# Patient Record
Sex: Female | Born: 1995 | Hispanic: Yes | Marital: Single | Smoking: Never smoker
Health system: Southern US, Community
[De-identification: ages and names within clinical notes are randomized; demographics above are authoritative.]

## PROBLEM LIST (undated history)

## (undated) ENCOUNTER — Inpatient Hospital Stay (HOSPITAL_COMMUNITY): Payer: Self-pay

## (undated) DIAGNOSIS — O139 Gestational [pregnancy-induced] hypertension without significant proteinuria, unspecified trimester: Secondary | ICD-10-CM

## (undated) DIAGNOSIS — Z789 Other specified health status: Secondary | ICD-10-CM

## (undated) DIAGNOSIS — Z8759 Personal history of other complications of pregnancy, childbirth and the puerperium: Secondary | ICD-10-CM

## (undated) HISTORY — PX: WISDOM TOOTH EXTRACTION: SHX21

## (undated) HISTORY — DX: Other specified health status: Z78.9

## (undated) HISTORY — DX: Personal history of other complications of pregnancy, childbirth and the puerperium: Z87.59

---
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
## 2014-02-09 ENCOUNTER — Ambulatory Visit: Payer: Self-pay | Admitting: Pediatrics

## 2020-08-01 DIAGNOSIS — Z3201 Encounter for pregnancy test, result positive: Secondary | ICD-10-CM | POA: Diagnosis not present

## 2020-08-06 ENCOUNTER — Emergency Department (HOSPITAL_COMMUNITY): Payer: BC Managed Care – PPO

## 2020-08-06 ENCOUNTER — Encounter (HOSPITAL_COMMUNITY): Payer: Self-pay | Admitting: Emergency Medicine

## 2020-08-06 ENCOUNTER — Emergency Department (HOSPITAL_COMMUNITY)
Admission: EM | Admit: 2020-08-06 | Discharge: 2020-08-07 | Disposition: A | Discharge status: Home / Self Care | Payer: BC Managed Care – PPO | Attending: Emergency Medicine | Admitting: Emergency Medicine

## 2020-08-06 ENCOUNTER — Other Ambulatory Visit: Payer: Self-pay

## 2020-08-06 DIAGNOSIS — Z114 Encounter for screening for human immunodeficiency virus [HIV]: Secondary | ICD-10-CM | POA: Diagnosis not present

## 2020-08-06 DIAGNOSIS — Z79899 Other long term (current) drug therapy: Secondary | ICD-10-CM | POA: Insufficient documentation

## 2020-08-06 DIAGNOSIS — O208 Other hemorrhage in early pregnancy: Secondary | ICD-10-CM | POA: Insufficient documentation

## 2020-08-06 DIAGNOSIS — N939 Abnormal uterine and vaginal bleeding, unspecified: Secondary | ICD-10-CM | POA: Diagnosis not present

## 2020-08-06 DIAGNOSIS — O468X1 Other antepartum hemorrhage, first trimester: Secondary | ICD-10-CM

## 2020-08-06 DIAGNOSIS — Z3A01 Less than 8 weeks gestation of pregnancy: Secondary | ICD-10-CM | POA: Insufficient documentation

## 2020-08-06 LAB — URINALYSIS, ROUTINE W REFLEX MICROSCOPIC
Bilirubin Urine: NEGATIVE
Glucose, UA: NEGATIVE mg/dL
Hgb urine dipstick: NEGATIVE
Ketones, ur: 80 mg/dL — AB
Leukocytes,Ua: NEGATIVE
Nitrite: NEGATIVE
Protein, ur: NEGATIVE mg/dL
Specific Gravity, Urine: 1.028 (ref 1.005–1.030)
pH: 5 (ref 5.0–8.0)

## 2020-08-06 LAB — WET PREP, GENITAL
Sperm: NONE SEEN
Trich, Wet Prep: NONE SEEN
Yeast Wet Prep HPF POC: NONE SEEN

## 2020-08-06 LAB — COMPREHENSIVE METABOLIC PANEL
ALT: 22 U/L (ref 0–44)
AST: 17 U/L (ref 15–41)
Albumin: 4.2 g/dL (ref 3.5–5.0)
Alkaline Phosphatase: 61 U/L (ref 38–126)
Anion gap: 11 (ref 5–15)
BUN: 11 mg/dL (ref 6–20)
CO2: 22 mmol/L (ref 22–32)
Calcium: 8.6 mg/dL — ABNORMAL LOW (ref 8.9–10.3)
Chloride: 103 mmol/L (ref 98–111)
Creatinine, Ser: 0.61 mg/dL (ref 0.44–1.00)
GFR, Estimated: >60 mL/min (ref >60)
Glucose, Bld: 62 mg/dL — ABNORMAL LOW (ref 70–99)
Potassium: 3.4 mmol/L — ABNORMAL LOW (ref 3.5–5.1)
Sodium: 136 mmol/L (ref 135–145)
Total Bilirubin: 1 mg/dL (ref 0.3–1.2)
Total Protein: 7.2 g/dL (ref 6.5–8.1)

## 2020-08-06 LAB — PREGNANCY, URINE: Preg Test, Ur: POSITIVE — AB

## 2020-08-06 LAB — CBC WITH DIFFERENTIAL/PLATELET
Abs Immature Granulocytes: 0.06 10*3/uL (ref 0.00–0.07)
Basophils Absolute: 0.1 10*3/uL (ref 0.0–0.1)
Basophils Relative: 0 %
Eosinophils Absolute: 0.1 10*3/uL (ref 0.0–0.5)
Eosinophils Relative: 0 %
HCT: 41.8 % (ref 36.0–46.0)
Hemoglobin: 14 g/dL (ref 12.0–15.0)
Immature Granulocytes: 0 %
Lymphocytes Relative: 17 %
Lymphs Abs: 2.2 10*3/uL (ref 0.7–4.0)
MCH: 29.5 pg (ref 26.0–34.0)
MCHC: 33.5 g/dL (ref 30.0–36.0)
MCV: 88 fL (ref 80.0–100.0)
Monocytes Absolute: 0.7 10*3/uL (ref 0.1–1.0)
Monocytes Relative: 5 %
Neutro Abs: 10.4 10*3/uL — ABNORMAL HIGH (ref 1.7–7.7)
Neutrophils Relative %: 78 %
Platelets: 331 10*3/uL (ref 150–400)
RBC: 4.75 MIL/uL (ref 3.87–5.11)
RDW: 12.2 % (ref 11.5–15.5)
WBC: 13.5 10*3/uL — ABNORMAL HIGH (ref 4.0–10.5)
nRBC: 0 % (ref 0.0–0.2)

## 2020-08-06 LAB — HCG, QUANTITATIVE, PREGNANCY: hCG, Beta Chain, Quant, S: 37452 m[IU]/mL — ABNORMAL HIGH (ref <5)

## 2020-08-06 LAB — RAPID HIV SCREEN (HIV 1/2 AB+AG)
HIV 1/2 Antibodies: NONREACTIVE
HIV-1 P24 Antigen - HIV24: NONREACTIVE
Interpretation (HIV Ag Ab): NONREACTIVE

## 2020-08-06 NOTE — ED Provider Notes (Signed)
Stephanie [name] Provider Note   CSN: [id number] Arrival date & time: 08/06/20  1558     History Chief Complaint  Patient presents with  . Vaginal Bleeding    Stephanie [name] is a 24 y.o. female.  HPI Patient is a 24 year old female she states she is [redacted] weeks pregnant.  She states her last menstrual period was November 2.  Patient has presented today with 2 episodes of vaginal bleeding.  She states that her last menstrual period was November 2.  She has not had an OB/GYN visit yet she states she has not had any additional pregnancy test done. She states that Saturday she had an episode of bright red blood per her vagina.  She states that she has had no other vaginal discharge.  She is with her partner currently who states that he has no penile discharge or any other symptoms.  She has a history of obesity but no other significant past medical history.  She denies any nausea, vomiting lightheadedness or dizziness.  She does state that she had some lower abdominal pain that was brief and is now resolved.  She has no other symptoms at this time.  No aggravating or mitigating factors.  She is taken no medication prior to arrival in ER     History reviewed. No pertinent past medical history.  Patient Active Problem List   Diagnosis Date Noted  . BMI (body mass index), pediatric, 85% to less than 95% for age 32/16/2015  . Learning problem 12/31/2013    History reviewed. No pertinent surgical history.   OB History   No obstetric history on file.     Family History  Problem Relation Age of Onset  . Asthma Brother   . Diabetes Maternal Grandmother   . Migraines Mother     Social History   Tobacco Use  . Smoking status: Never Smoker    Home Medications Prior to Admission medications   Medication Sig Start Date End Date Taking? Authorizing Provider  acetaminophen (TYLENOL) 500 MG tablet Take 250 mg by mouth every 6 (six) hours as needed for  moderate pain.   Yes [provider]  Prenatal Vit-Fe Fumarate-FA (PRENATAL MULTIVITAMIN) TABS tablet Take 1 tablet by mouth daily at 12 noon.   Yes [provider]    Allergies    Patient has no known allergies.  Review of Systems   Review of Systems  Constitutional: Negative for chills and fever.  HENT: Negative for congestion.   Eyes: Negative for pain.  Respiratory: Negative for cough and shortness of breath.   Cardiovascular: Negative for chest pain and leg swelling.  Gastrointestinal: Negative for abdominal pain, diarrhea, nausea and vomiting.  Genitourinary: Positive for vaginal bleeding. Negative for dysuria and vaginal discharge.  Musculoskeletal: Negative for myalgias.  Skin: Negative for rash.  Neurological: Negative for dizziness and headaches.    Physical Exam Updated Vital Signs BP 109/68   Pulse 83   Temp 98.6 F (37 C) (Oral)   Resp 18   LMP 07/07/2020   SpO2 97%   Physical Exam Vitals and nursing note reviewed.  Constitutional:      General: She is not in acute distress.    Comments: Pleasant well-appearing 24 year old.  In no acute distress.  Sitting comfortably in bed.  Able answer questions appropriately follow commands. No increased work of breathing. Speaking in full sentences.  HENT:     Head: Normocephalic and atraumatic.     Nose: Nose normal.  Mouth/Throat:     Mouth: Mucous membranes are moist.     Comments: Oral mucosa and conjunctive a without any pallor. Eyes:     General: No scleral icterus. Cardiovascular:     Rate and Rhythm: Normal rate and regular rhythm.     Pulses: Normal pulses.     Heart sounds: Normal heart sounds.  Pulmonary:     Effort: Pulmonary effort is normal. No respiratory distress.     Breath sounds: No wheezing.  Abdominal:     Palpations: Abdomen is soft.     Tenderness: There is no abdominal tenderness. There is no right CVA tenderness, left CVA tenderness, guarding or rebound.   Genitourinary:    Comments: Vulva without lesions or abnormality Vaginal canal with brown discharge present in the vault Cervix appears normal, is closed No adnexal tenderness or CMT Musculoskeletal:     Cervical back: Normal range of motion.     Right lower leg: No edema.     Left lower leg: No edema.  Skin:    General: Skin is warm and dry.     Capillary Refill: Capillary refill takes less than 2 seconds.  Neurological:     Mental Status: She is alert. Mental status is at baseline.  Psychiatric:        Mood and Affect: Mood normal.        Behavior: Behavior normal.     ED Results / Procedures / Treatments   Labs (all labs ordered are listed, but only abnormal results are displayed) Labs Reviewed  WET PREP, GENITAL - Abnormal; Notable for the following components:      Result Value   Clue Cells Wet Prep HPF POC PRESENT (*)    WBC, Wet Prep HPF POC PRESENT (*)    All other components within normal limits  URINALYSIS, ROUTINE W REFLEX MICROSCOPIC - Abnormal; Notable for the following components:   Ketones, ur 80 (*)    All other components within normal limits  PREGNANCY, URINE - Abnormal; Notable for the following components:   Preg Test, Ur POSITIVE (*)    All other components within normal limits  CBC WITH DIFFERENTIAL/PLATELET  COMPREHENSIVE METABOLIC PANEL  HCG, QUANTITATIVE, PREGNANCY  RPR  HIV ANTIBODY (ROUTINE TESTING W REFLEX)  RAPID HIV SCREEN (HIV 1/2 AB+AG)  ABO/RH  GC/CHLAMYDIA PROBE AMP ([place]) NOT AT [facility]    EKG None  Radiology US OB LESS THAN 14 WEEKS WITH OB TRANSVAGINAL  Result Date: 08/06/2020 CLINICAL DATA:  Initial evaluation for acute vaginal bleeding, early pregnancy. EXAM: OBSTETRIC <14 WK [place] AND TRANSVAGINAL OB US TECHNIQUE: Both transabdominal and transvaginal ultrasound examinations were performed for complete evaluation of the gestation as well as the maternal uterus, adnexal regions, and pelvic cul-de-sac. Transvaginal technique  was performed to assess early pregnancy. COMPARISON:  None. FINDINGS: Intrauterine gestational sac: Single Yolk sac:  Present Embryo:  Present Cardiac Activity: Present Heart Rate: 119 bpm CRL: 5.2 mm   6 w   2 d                  [place] EDC: 03/30/2021 Subchorionic hemorrhage: Small subchorionic hemorrhage without associated mass effect. Maternal uterus/adnexae: Left ovary within normal limits. 1.8 x 1.9 x 1.5 cm complex cyst within the right ovary with peripherally increased vascularity, most consistent with a degenerating corpus luteal cyst. No adnexal mass. Small volume free fluid present within the pelvis. IMPRESSION: 1. Single viable intrauterine pregnancy as above, estimated gestational age [redacted] weeks and 2 days  by crown-rump length, with ultrasound EDC of 03/30/2021. 2. Small subchorionic hemorrhage without associated mass effect. 3. 1.9 cm probable degenerating right ovarian corpus luteal cyst. 4. No other acute maternal uterine or adnexal abnormality. Electronically Signed   By: Rise Mu M.D.   On: 08/06/2020 22:04    Procedures Procedures (including critical care time)  Medications Ordered in ED Medications - No data to display  ED Course  I have reviewed the triage vital signs and the nursing notes.  Pertinent labs & imaging results that were available during my care of the patient were reviewed by me and considered in my medical decision making (see chart for details).    MDM Rules/Calculators/A&P                          Patient is a 24 year old female with a history of obesity presented today with vaginal bleeding.  She is concerned given that this is 6 weeks into a presumed pregnancy she has had a positive home pregnancy test.  She has not seen an OB/GYN yet.  She did have some lower abdominal discomfort however this is completely resolved at this time.  Vital signs are within normal limits and she has otherwise very unremarkable physical exam.  No cervical motion tenderness  or adnexal tenderness.  Will obtain ultrasound to evaluate for intrauterine pregnancy.  We will also obtain basic lab work for abdominal complaint.  Urine pregnancy test was positive.  Urinalysis negative for any evidence of infection.  Ultrasound shows single viable intrauterine pregnancy.  Small subchorionic hemorrhage without associated mass-effect.  This is likely the source of her bleeding.  Wet prep, abdominal labs to be followed up on by oncoming PA Mia M ABO/Rh obtained as patient is having vaginal bleeding may need RhoGam.   Final Clinical Impression(s) / ED Diagnoses Final diagnoses:  Vaginal bleeding    Rx / DC Orders ED Discharge Orders    None       Gailen Shelter, [place] 08/06/20 2218    Alvira Monday, MD 08/07/20 1226

## 2020-08-06 NOTE — ED Provider Notes (Signed)
24 year old female received at signout from [place] pending CBC and metabolic panel.  Per his HPI:  "Patient is a 24 year old female she states she is [redacted] weeks pregnant.  She states her last menstrual period was November 2.  Patient has presented today with 2 episodes of vaginal bleeding.  She states that her last menstrual period was November 2.  She has not had an OB/GYN visit yet she states she has not had any additional pregnancy test done. She states that Saturday she had an episode of bright red blood per her vagina.  She states that she has had no other vaginal discharge.  She is with her partner currently who states that he has no penile discharge or any other symptoms.  She has a history of obesity but no other significant past medical history.  She denies any nausea, vomiting lightheadedness or dizziness.  She does state that she had some lower abdominal pain that was brief and is now resolved.  She has no other symptoms at this time.  No aggravating or mitigating factors.  She is taken no medication prior to arrival in ER   History reviewed. No pertinent past medical history."  States that she is feeling a little lightheaded and faint as she has not eaten since arriving in the ER more than 8 hours ago.  Physical Exam  BP 101/60   Pulse 70   Temp 98.6 F (37 C) (Oral)   Resp 18   LMP 07/07/2020   SpO2 98%   Physical Exam Vitals and nursing note reviewed.  Constitutional:      General: She is not in acute distress.    Appearance: She is well-developed and well-nourished. She is not ill-appearing, toxic-appearing or diaphoretic.  HENT:     Head: Normocephalic and atraumatic.  Abdominal:     General: There is no distension.     Tenderness: There is no abdominal tenderness. There is no guarding or rebound.  Musculoskeletal:        General: Normal range of motion.     Cervical back: Normal range of motion.  Neurological:     Mental Status: She is alert and oriented to  person, place, and time.    ED Course/Procedures     Procedures  MDM  24 year old female received a signout from [place] pending labs, specifically CBC and metabolic panel.  Please see his note for further work-up and medical decision making.  I have reviewed all of the patient's labs and imaging.  She appears to have a small subchorionic hemorrhage noted on ultrasounds, which is likely the source of her vaginal bleeding.  Doubt ectopic pregnancy or spontaneous abortion.  Hemoglobin is improved from previous at 14.  Glucose is 62, likely secondary to the fact that she has not eaten in more than 8 hours since arriving in the emergency department.  She was given juice and repeat CBG was 105.  Encouraged good fluid and food intake when she arrives home.  She does note that she had intercourse earlier today prior to onset of vaginal bleeding.  She will avoid intercourse until she has been seen by OB/GYN in early January.  She was encouraged to continue taking daily prenatal vitamins.  At this time she is hemodynamically stable and in no acute distress.  ER return precautions given.  Safe for discharge to home with outpatient follow-up to OB/GYN.      Barkley Boards, PA-C 08/07/20 0015    Marily Memos, MD 08/07/20 0120

## 2020-08-06 NOTE — ED Triage Notes (Signed)
Patient reports approximately [redacted] weeks pregnant. LMP 11/20. C/o brown vaginal discharge x3 days. Denies pain.

## 2020-08-07 LAB — CBG MONITORING, ED: Glucose-Capillary: 105 mg/dL — ABNORMAL HIGH (ref 70–99)

## 2020-08-07 LAB — ABO/RH: ABO/RH(D): O POS

## 2020-08-07 LAB — HIV ANTIBODY (ROUTINE TESTING W REFLEX): HIV Screen 4th Generation wRfx: NONREACTIVE

## 2020-08-07 LAB — GC/CHLAMYDIA PROBE AMP (~~LOC~~) NOT AT ARMC
Chlamydia: NEGATIVE
Comment: NEGATIVE
Comment: NORMAL
Neisseria Gonorrhea: NEGATIVE

## 2020-08-07 LAB — RPR: RPR Ser Ql: NONREACTIVE

## 2020-08-07 NOTE — Discharge Instructions (Signed)
Thank you for allowing me to care for you today in the Emergency Department.   Please keep your follow-up appointment with her OB/GYN.  Continue your home prenatal vitamins.  You can take Tylenol as needed for pain.  Since you are pregnant, you should avoid NSAID medications such as naproxen or ibuprofen.  Additional information on subchorionic hemorrhage is attached along with your paperwork.  You should return to the emergency department if you develop new or worsening symptoms including worsening abdominal pain, heavier vaginal bleeding, if you are passing clots, if you pass out, if you start having fevers, uncontrollable vomiting, or other new, concerning symptoms.  We are always happy to evaluate you at any emergency department, but the [facility] is located at [facility].  The address and information is located below.  [facility] at [facility] [address] [place],  [place]  [address]

## 2020-08-24 DIAGNOSIS — N911 Secondary amenorrhea: Secondary | ICD-10-CM | POA: Diagnosis not present

## 2020-09-12 DIAGNOSIS — Z3481 Encounter for supervision of other normal pregnancy, first trimester: Secondary | ICD-10-CM | POA: Diagnosis not present

## 2020-09-12 DIAGNOSIS — Z113 Encounter for screening for infections with a predominantly sexual mode of transmission: Secondary | ICD-10-CM | POA: Diagnosis not present

## 2020-09-12 DIAGNOSIS — Z3401 Encounter for supervision of normal first pregnancy, first trimester: Secondary | ICD-10-CM | POA: Diagnosis not present

## 2020-09-12 DIAGNOSIS — Z3400 Encounter for supervision of normal first pregnancy, unspecified trimester: Secondary | ICD-10-CM | POA: Diagnosis not present

## 2020-09-12 DIAGNOSIS — Z3685 Encounter for antenatal screening for Streptococcus B: Secondary | ICD-10-CM | POA: Diagnosis not present

## 2020-09-12 DIAGNOSIS — Z01419 Encounter for gynecological examination (general) (routine) without abnormal findings: Secondary | ICD-10-CM | POA: Diagnosis not present

## 2020-09-12 DIAGNOSIS — Z3A12 12 weeks gestation of pregnancy: Secondary | ICD-10-CM | POA: Diagnosis not present

## 2020-09-21 ENCOUNTER — Inpatient Hospital Stay (HOSPITAL_COMMUNITY): Payer: BC Managed Care – PPO

## 2020-09-21 ENCOUNTER — Inpatient Hospital Stay (HOSPITAL_COMMUNITY)
Admission: AD | Admit: 2020-09-21 | Discharge: 2020-09-21 | Disposition: A | Discharge status: Home / Self Care | Payer: BC Managed Care – PPO | Attending: Obstetrics and Gynecology | Admitting: Obstetrics and Gynecology

## 2020-09-21 ENCOUNTER — Other Ambulatory Visit: Payer: Self-pay

## 2020-09-21 ENCOUNTER — Encounter (HOSPITAL_COMMUNITY): Payer: Self-pay | Admitting: Obstetrics and Gynecology

## 2020-09-21 DIAGNOSIS — O029 Abnormal product of conception, unspecified: Secondary | ICD-10-CM | POA: Diagnosis not present

## 2020-09-21 DIAGNOSIS — O26891 Other specified pregnancy related conditions, first trimester: Secondary | ICD-10-CM | POA: Diagnosis not present

## 2020-09-21 DIAGNOSIS — O039 Complete or unspecified spontaneous abortion without complication: Secondary | ICD-10-CM

## 2020-09-21 DIAGNOSIS — Z79899 Other long term (current) drug therapy: Secondary | ICD-10-CM | POA: Diagnosis not present

## 2020-09-21 DIAGNOSIS — R102 Pelvic and perineal pain: Secondary | ICD-10-CM

## 2020-09-21 DIAGNOSIS — R109 Unspecified abdominal pain: Secondary | ICD-10-CM | POA: Diagnosis not present

## 2020-09-21 DIAGNOSIS — O021 Missed abortion: Secondary | ICD-10-CM | POA: Diagnosis not present

## 2020-09-21 DIAGNOSIS — O209 Hemorrhage in early pregnancy, unspecified: Secondary | ICD-10-CM | POA: Diagnosis not present

## 2020-09-21 DIAGNOSIS — Z3A12 12 weeks gestation of pregnancy: Secondary | ICD-10-CM | POA: Diagnosis not present

## 2020-09-21 DIAGNOSIS — Z3A00 Weeks of gestation of pregnancy not specified: Secondary | ICD-10-CM | POA: Diagnosis not present

## 2020-09-21 DIAGNOSIS — O208 Other hemorrhage in early pregnancy: Secondary | ICD-10-CM | POA: Diagnosis not present

## 2020-09-21 DIAGNOSIS — R112 Nausea with vomiting, unspecified: Secondary | ICD-10-CM | POA: Insufficient documentation

## 2020-09-21 LAB — CBC
HCT: 38.9 % (ref 36.0–46.0)
HCT: 36.8 % (ref 36.0–46.0)
Hemoglobin: 14.1 g/dL (ref 12.0–15.0)
Hemoglobin: 12.8 g/dL (ref 12.0–15.0)
MCH: 30.5 pg (ref 26.0–34.0)
MCH: 29.9 pg (ref 26.0–34.0)
MCHC: 36.2 g/dL — ABNORMAL HIGH (ref 30.0–36.0)
MCHC: 34.8 g/dL (ref 30.0–36.0)
MCV: 84 fL (ref 80.0–100.0)
MCV: 86 fL (ref 80.0–100.0)
Platelets: 306 10*3/uL (ref 150–400)
Platelets: 265 10*3/uL (ref 150–400)
RBC: 4.63 MIL/uL (ref 3.87–5.11)
RBC: 4.28 MIL/uL (ref 3.87–5.11)
RDW: 11.9 % (ref 11.5–15.5)
RDW: 11.8 % (ref 11.5–15.5)
WBC: 18.4 10*3/uL — ABNORMAL HIGH (ref 4.0–10.5)
WBC: 24.7 10*3/uL — ABNORMAL HIGH (ref 4.0–10.5)
nRBC: 0 % (ref 0.0–0.2)
nRBC: 0 % (ref 0.0–0.2)

## 2020-09-21 LAB — COMPREHENSIVE METABOLIC PANEL
ALT: 33 U/L (ref 0–44)
AST: 26 U/L (ref 15–41)
Albumin: 3.6 g/dL (ref 3.5–5.0)
Alkaline Phosphatase: 68 U/L (ref 38–126)
Anion gap: 12 (ref 5–15)
BUN: 6 mg/dL (ref 6–20)
CO2: 19 mmol/L — ABNORMAL LOW (ref 22–32)
Calcium: 8.6 mg/dL — ABNORMAL LOW (ref 8.9–10.3)
Chloride: 104 mmol/L (ref 98–111)
Creatinine, Ser: 0.65 mg/dL (ref 0.44–1.00)
GFR, Estimated: >60 mL/min (ref >60)
Glucose, Bld: 101 mg/dL — ABNORMAL HIGH (ref 70–99)
Potassium: 3.1 mmol/L — ABNORMAL LOW (ref 3.5–5.1)
Sodium: 135 mmol/L (ref 135–145)
Total Bilirubin: 1.1 mg/dL (ref 0.3–1.2)
Total Protein: 6.7 g/dL (ref 6.5–8.1)

## 2020-09-21 LAB — URINALYSIS, ROUTINE W REFLEX MICROSCOPIC
Bilirubin Urine: NEGATIVE
Glucose, UA: NEGATIVE mg/dL
Ketones, ur: 20 mg/dL — AB
Nitrite: NEGATIVE
Protein, ur: 30 mg/dL — AB
Specific Gravity, Urine: 1.025 (ref 1.005–1.030)
pH: 7 (ref 5.0–8.0)

## 2020-09-21 LAB — TYPE AND SCREEN
ABO/RH(D): O POS
Antibody Screen: NEGATIVE

## 2020-09-21 MED ORDER — OXYBUTYNIN CHLORIDE 5 MG PO TABS
10.0000 mg | ORAL_TABLET | Freq: Once | ORAL | Status: DC
  Filled 2020-09-21: qty 2

## 2020-09-21 MED ORDER — OXYCODONE-ACETAMINOPHEN 5-325 MG PO TABS: 1.0000 | ORAL_TABLET | Freq: Four times a day (QID) | ORAL | 0 refills | Status: DC | PRN

## 2020-09-21 MED ORDER — ONDANSETRON HCL 4 MG/2ML IJ SOLN
4.0000 mg | Freq: Once | INTRAMUSCULAR | Status: AC
  Administered 2020-09-21: 4 mg via INTRAVENOUS
  Filled 2020-09-21: qty 2

## 2020-09-21 MED ORDER — OXYCODONE-ACETAMINOPHEN 5-325 MG PO TABS
2.0000 | ORAL_TABLET | Freq: Once | ORAL | Status: AC
  Administered 2020-09-21: 2 via ORAL
  Filled 2020-09-21: qty 2

## 2020-09-21 MED ORDER — MISOPROSTOL 200 MCG PO TABS
600.0000 ug | ORAL_TABLET | Freq: Once | ORAL | Status: AC
  Administered 2020-09-21: 600 ug via BUCCAL
  Filled 2020-09-21: qty 3

## 2020-09-21 MED ORDER — IBUPROFEN 600 MG PO TABS: 600.0000 mg | ORAL_TABLET | Freq: Four times a day (QID) | ORAL | 1 refills | Status: DC | PRN

## 2020-09-21 MED ORDER — LACTATED RINGERS IV SOLN: INTRAVENOUS | Status: DC

## 2020-09-21 MED ORDER — LACTATED RINGERS IV SOLN: Freq: Once | INTRAVENOUS | Status: AC

## 2020-09-21 MED ORDER — HYDROMORPHONE HCL 1 MG/ML IJ SOLN
1.0000 mg | Freq: Once | INTRAMUSCULAR | Status: AC
Start: 2020-09-21 — End: 2020-09-21
  Administered 2020-09-21: 1 mg via INTRAVENOUS
  Filled 2020-09-21: qty 1

## 2020-09-21 MED ORDER — HYDROMORPHONE HCL 1 MG/ML IJ SOLN
1.0000 mg | Freq: Once | INTRAMUSCULAR | Status: AC
  Administered 2020-09-21: 1 mg via INTRAVENOUS
  Filled 2020-09-21: qty 1

## 2020-09-21 MED ORDER — KETOROLAC TROMETHAMINE 60 MG/2ML IM SOLN
60.0000 mg | Freq: Once | INTRAMUSCULAR | Status: AC
  Administered 2020-09-21: 60 mg via INTRAMUSCULAR
  Filled 2020-09-21: qty 2

## 2020-09-21 NOTE — MAU Note (Signed)
Presents with c/o abdominal pain in lower abdomen that began last night @ midnight, describes pain as cramping.  Denies VB.  Also reports N/V that began last night @ 2200.  Reports has vomited 4x.

## 2020-09-21 NOTE — Progress Notes (Signed)
Ultrasonographer @ bedside performing ultrasound study.

## 2020-09-21 NOTE — MAU Provider Note (Addendum)
Chief Complaint: Abdominal Pain and Emesis   Event Date/Time   First Provider Initiated Contact with Patient 09/21/20 0741        SUBJECTIVE HPI: Stephanie [name] is a 25 y.o. G1P0 at 66w6dby LMP who presents to maternity admissions reporting severe pelvic pain.  Started at midnight last night.  Got worse as the night went on   Unable to reach her doctor by phone.   Had vomiting which started at 2200.  This is new. . She denies vaginal bleeding, vaginal itching/burning, urinary symptoms, h/a, dizziness, or fever/chills.    Abdominal Pain This is a new problem. The current episode started yesterday. The onset quality is gradual. The problem occurs intermittently (comes and goes). The problem has been gradually worsening. The pain is located in the LLQ, RLQ and suprapubic region. The pain is severe. The quality of the pain is cramping and sharp. Associated symptoms include nausea and vomiting. Pertinent negatives include no constipation, diarrhea, dysuria, fever, frequency, headaches or myalgias. The pain is aggravated by palpation. The pain is relieved by nothing. She has tried nothing for the symptoms.  Emesis  This is a recurrent problem. The current episode started yesterday. The problem has been unchanged. There has been no fever. Associated symptoms include abdominal pain. Pertinent negatives include no chills, diarrhea, fever, headaches or myalgias. She has tried nothing for the symptoms.   RN Note: Presents with c/o abdominal pain in lower abdomen that began last night @ midnight, describes pain as cramping.  Denies VB.  Also reports N/V that began last night @ 2200.  Reports has vomited 4x.  History reviewed. No pertinent past medical history. History reviewed. No pertinent surgical history. Social History   Socioeconomic History  . Marital status: Single    Spouse name: Not on file  . Number of children: Not on file  . Years of education: Not on file  . Highest education level: Not  on file  Occupational History  . Not on file  Tobacco Use  . Smoking status: Never Smoker  . Smokeless tobacco: Never Used  Vaping Use  . Vaping Use: Never used  Substance and Sexual Activity  . Alcohol use: Not on file  . Drug use: Never  . Sexual activity: Yes  Other Topics Concern  . Not on file  Social History Narrative  . Not on file   Social Determinants of Health   Financial Resource Strain: Not on file  Food Insecurity: Not on file  Transportation Needs: Not on file  Physical Activity: Not on file  Stress: Not on file  Social Connections: Not on file  Intimate Partner Violence: Not on file   No current facility-administered medications on file prior to encounter.   Current Outpatient Medications on File Prior to Encounter  Medication Sig Dispense Refill  . acetaminophen (TYLENOL) 500 MG tablet Take 250 mg by mouth every 6 (six) hours as needed for moderate pain.    . Prenatal Vit-Fe Fumarate-FA (PRENATAL MULTIVITAMIN) TABS tablet Take 1 tablet by mouth daily at 12 noon.     No Known Allergies  I have reviewed patient's Past Medical Hx, Surgical Hx, Family Hx, Social Hx, medications and allergies.   ROS:  Review of Systems  Constitutional: Negative for chills and fever.  Gastrointestinal: Positive for abdominal pain, nausea and vomiting. Negative for constipation and diarrhea.  Genitourinary: Negative for dysuria and frequency.  Musculoskeletal: Negative for myalgias.  Neurological: Negative for headaches.   Review of Systems  Other systems  negative   Physical Exam  Physical Exam Vitals:   09/21/20 1110 09/21/20 1115  BP: (!) 95/46 (!) 104/56  Pulse: 62 77  Resp:    Temp:    SpO2: 97% 99%    Constitutional: Well-developed, well-nourished female in no acute distress, but crying out in pain.  Cardiovascular: normal rate Respiratory: normal effort GI: Abd soft, tender over lower abdomen. No rebound or guarding.  MS: Extremities nontender, no edema,  normal ROM Neurologic: Alert and oriented x 4.  GU: Neg CVAT.  PELVIC EXAM: deferred for now due to severity of pain  I have ordered labs to assess for acute abdomen I have ordered an [place] to evaluate pregnancy LR infusion with Dilaudid for pain ordered  Report given to oncoming CNM  Hansel Feinstein CNM, MSN Certified Nurse-Midwife 09/21/2020  11:28 AM   Lab results: Results for orders placed or performed during the hospital encounter of 09/21/20 (from the past 24 hour(s))  Urinalysis, Routine w reflex microscopic Urine, Clean Catch     Status: Abnormal   Collection Time: 09/21/20  7:40 AM  Result Value Ref Range   Color, Urine AMBER (A) YELLOW   APPearance CLOUDY (A) CLEAR   Specific Gravity, Urine 1.025 1.005 - 1.030   pH 7.0 5.0 - 8.0   Glucose, UA NEGATIVE NEGATIVE mg/dL   Hgb urine dipstick SMALL (A) NEGATIVE   Bilirubin Urine NEGATIVE NEGATIVE   Ketones, ur 20 (A) NEGATIVE mg/dL   Protein, ur 30 (A) NEGATIVE mg/dL   Nitrite NEGATIVE NEGATIVE   Leukocytes,Ua SMALL (A) NEGATIVE   RBC / HPF 6-10 0 - 5 RBC/hpf   WBC, UA 21-50 0 - 5 WBC/hpf   Bacteria, UA RARE (A) NONE SEEN   Squamous Epithelial / LPF 6-10 0 - 5   Mucus PRESENT    Amorphous Crystal PRESENT   CBC     Status: Abnormal   Collection Time: 09/21/20  7:56 AM  Result Value Ref Range   WBC 18.4 (H) 4.0 - 10.5 K/uL   RBC 4.63 3.87 - 5.11 MIL/uL   Hemoglobin 14.1 12.0 - 15.0 g/dL   HCT 38.9 36.0 - 46.0 %   MCV 84.0 80.0 - 100.0 fL   MCH 30.5 26.0 - 34.0 pg   MCHC 36.2 (H) 30.0 - 36.0 g/dL   RDW 11.9 11.5 - 15.5 %   Platelets 306 150 - 400 K/uL   nRBC 0.0 0.0 - 0.2 %  Comprehensive metabolic panel     Status: Abnormal   Collection Time: 09/21/20  7:56 AM  Result Value Ref Range   Sodium 135 135 - 145 mmol/L   Potassium 3.1 (L) 3.5 - 5.1 mmol/L   Chloride 104 98 - 111 mmol/L   CO2 19 (L) 22 - 32 mmol/L   Glucose, Bld 101 (H) 70 - 99 mg/dL   BUN 6 6 - 20 mg/dL   Creatinine, Ser 0.65 0.44 - 1.00 mg/dL    Calcium 8.6 (L) 8.9 - 10.3 mg/dL   Total Protein 6.7 6.5 - 8.1 g/dL   Albumin 3.6 3.5 - 5.0 g/dL   AST 26 15 - 41 U/L   ALT 33 0 - 44 U/L   Alkaline Phosphatase 68 38 - 126 U/L   Total Bilirubin 1.1 0.3 - 1.2 mg/dL   GFR, Estimated >60 >60 mL/min   Anion gap 12 5 - 15  Type and screen     Status: None   Collection Time: 09/21/20  7:58 AM  Result  Value Ref Range   ABO/RH(D) O POS    Antibody Screen NEG    Sample Expiration      09/24/2020,2359 Performed at [facility], [place] [address] [place], [place] [address]     MDM: Pt pain severe upon my arrival, approximately 30 minutes after Dilaudid 1 mg IV given.  Second dose of dilaudid ordered. Pain is suprapubic, low abdomen centrally located, and pt does not have acute abdomen, and no rebound tenderness or guarding, no pain at all to palpation in the RLQ.  WBCs returned at 18.4. OB US with normal FHR.  Gestational sac noted to be low in uterus, but no other abnormality noted.   Pain most likely uterine or bladder so transvaginal OB US and renal US with ureteral jets ordered.  Pt then called out to report that something was coming out and had passed intact gestational sac containing fetus with minimal bleeding. Gestational sac opened by CNM so pt could see fetus at her request.  Fetus appears to be female.  ANORA kit collected and products of conception sent to pathology. Pictures taken by RN and pt elected to take fetus home with keepsakes provided by the hospital.    Pt with cramping, dizziness and nausea, bleeding minimal after SAB.  Zofran and Toradol given. US shows retained POCs so Cytotec 600 mcg buccal given x 1.  Bleeding remained minimal with small amount of bleeding with small clots on pad in 1+ hour in MAU.  D/C home with follow up in office. Pt does not desire to continue with her OB practice and desires to f/u with [facility] so message sent for pt to f/u at [facility] with me.  Pt stable at time of D/C.  Rx for  ibuprofen and Percocet  A/P: 1. SAB (spontaneous abortion)   2. Pelvic pain affecting pregnancy   3. Abdominal pain during pregnancy in first trimester     D/C home with bleeding/pain precautions.   Fatima Blank, CNM 2:13 PM

## 2020-09-21 NOTE — Progress Notes (Signed)
Pt called out crying.  Bonna Gains, CNM and this RN went into room.  Pt passed POC, entire sac expelled.

## 2020-09-21 NOTE — Progress Notes (Signed)
Bleeding evaluated, WNL.

## 2020-09-21 NOTE — Progress Notes (Signed)
I offered support to Stephanie [name] several times over a period of a few hours.  They are grieving the loss of their first child.  I facilitated them sharing about their family, their hopes for growing their family and their feelings about being parents.  They are comforted by their belief that their baby is with Noah's dad and grandfather as well as other family members who have died.  I provided ministry of presence, grief education and brought them a prayer shawl.  They are aware of ongoing support resources.  Chaplain Dyanne Carrel, Bcc Pager, [phone] 2:40 PM

## 2020-09-21 NOTE — Progress Notes (Signed)
K. Netty Starring, Chaplain @ bedside.

## 2020-09-25 LAB — SURGICAL PATHOLOGY

## 2020-10-01 ENCOUNTER — Other Ambulatory Visit: Payer: Self-pay

## 2020-10-01 ENCOUNTER — Other Ambulatory Visit: Payer: BC Managed Care – PPO

## 2020-10-01 DIAGNOSIS — O039 Complete or unspecified spontaneous abortion without complication: Secondary | ICD-10-CM

## 2020-10-02 ENCOUNTER — Telehealth: Payer: Self-pay

## 2020-10-02 LAB — BETA HCG QUANT (REF LAB): hCG Quant: 186 m[IU]/mL

## 2020-10-02 NOTE — Telephone Encounter (Addendum)
-----   Message from Warden Fillers, MD sent at 10/02/2020  8:59 AM EST ----- Bhcg 186 s/p SAB, recommend repeat quant in 1 week, follow until normal   Called pt. Pt states she reviewed result in MyChart and has appt scheduled for 10/08/20 with provider.

## 2020-10-03 ENCOUNTER — Encounter: Payer: Self-pay | Admitting: Advanced Practice Midwife

## 2020-10-08 ENCOUNTER — Other Ambulatory Visit: Payer: Self-pay

## 2020-10-08 ENCOUNTER — Ambulatory Visit (INDEPENDENT_AMBULATORY_CARE_PROVIDER_SITE_OTHER): Payer: BC Managed Care – PPO | Admitting: Advanced Practice Midwife

## 2020-10-08 VITALS — BP 115/71 | HR 69 | Wt 176.0 lb

## 2020-10-08 DIAGNOSIS — Z8759 Personal history of other complications of pregnancy, childbirth and the puerperium: Secondary | ICD-10-CM | POA: Insufficient documentation

## 2020-10-08 DIAGNOSIS — Z9189 Other specified personal risk factors, not elsewhere classified: Secondary | ICD-10-CM | POA: Diagnosis not present

## 2020-10-08 DIAGNOSIS — O039 Complete or unspecified spontaneous abortion without complication: Secondary | ICD-10-CM

## 2020-10-08 NOTE — Progress Notes (Signed)
  GYNECOLOGY PROGRESS NOTE  History:  25 y.o. G1P0 presents to [facility] office today for follow up miscarriage visit. She was initially seen in MAU on 09/21/20 and delivered intact gestational sac at [redacted]w[redacted]d while in MAU.  She had hcg on 09/21/20 of 37,452 which was followed and dropped on 10/11/20 to 186.  The pt reports her bleeding following the miscarriage stopped 2 weeks ago but that she had an episode of postcoital bleeding last week.  She has not resumed normal menses.  There is no pain.   She denies h/a, dizziness, shortness of breath, n/v, or fever/chills.    The following portions of the patient's history were reviewed and updated as appropriate: allergies, current medications, past family history, past medical history, past social history, past surgical history and problem list.   Review of Systems:  Pertinent items are noted in HPI.   Objective:  Physical Exam Blood pressure 115/71, pulse 69, weight 176 lb (79.8 kg), last menstrual period 07/07/2020. VS reviewed, nursing note reviewed,  Constitutional: well developed, well nourished, no distress HEENT: normocephalic CV: normal rate Pulm/chest wall: normal effort Breast Exam: deferred Abdomen: soft Neuro: alert and oriented x 3 Skin: warm, dry Psych: affect normal Pelvic exam: Deferred  Assessment & Plan:  1. SAB (spontaneous abortion) --Significant drop in hcg and confirmed IUP with delivery of intact gestational sac but given pt recent postcoital bleeding, will follow hcg today. --Questions answered about causes, ANORA testing is normal female, pathology shows acute chorioamnionitis, which is c/w with pt elevated WBCs on the day of the SAB.  Likely infection led to miscarriage, but cannot discount other reasons including genetic reasons not tested in [place].  There is nothing to prevent this in future pregnancy. STD testing negative and pt not at risk.  Reassurance provided that pt did not cause the miscarriage. --Pt desires  pregnancy.  I recommend she take folic acid/PNV daily and can plan pregnancy when desired.  She should seek early prenatal care in next pregnancy.  - Beta hCG quant (ref lab)  2. Miscarriage within last 12 months  --Pt reports she is doing well but still has a lot of questions and is fearful/anxious about future pregnancies. --Pt desires virtual counseling visit - Ambulatory referral to [facility]  3. At risk for depression - Ambulatory referral to [facility]   Sharen Counter, CNM 9:22 AM

## 2020-10-08 NOTE — Patient Instructions (Signed)
Preparing for Pregnancy If you are planning to become pregnant, talk to your health care provider about preconception care. This type of care helps you prepare for a safe and healthy pregnancy. During this visit, your health care provider will:  Do a complete physical exam, including a Pap test.  Take your complete medical history.  Give you information, answer your questions, and help you resolve problems. Preconception checklist Medical history  Tell your health care provider about any medical conditions you have or have had. Your pregnancy or your ability to become pregnant may be affected by long-term (chronic) conditions, such as: ? Diabetes. ? High blood pressure (hypertension). ? Thyroid problems.  Tell your health care provider about your family's medical history and your partner's medical history.   Tell your health care provider if you have or have had any sexually transmitted infections, or STIs. These can affect your pregnancy. In some cases, they can be passed to your baby.  If needed, discuss the benefits of genetic testing. This test checks for conditions that may be passed from parent to child.  Tell your health care provider about: ? Any problems you had getting pregnant or while pregnant. ? Any medicines you take. These include vitamins, herbal supplements, and over-the-counter medicines. ? Your history of getting vaccines. Discuss any vaccines that you may need. Diet  Ask your health care provider about what foods to eat in order to get a balance of nutrients. This is especially important when you are pregnant or preparing to become pregnant. It is recommended that women of childbearing age take a folic acid supplement of 400 mcg daily and eat foods rich in folic acid to prevent certain birth defects.  Ask your health care provider to help you reach a healthy weight before pregnancy. ? If you are overweight, you may have a higher risk for certain problems. These  include hypertension, diabetes, and early (preterm) birth. ? If you are underweight, you are more likely to have a baby who has a low birth weight. Lifestyle, work, and home Let your health care provider know about:  Any lifestyle habits that you have, such as use of alcohol, drugs, or tobacco products.  Fun and leisure activities that may put you at risk during pregnancy, such as downhill skiing and certain exercise programs.  Any plans to travel out of the country, especially to places with an active Zika virus outbreak.  Harmful substances that you may be exposed to at work or at home. These include chemicals, pesticides, radiation, and substances from cat litter boxes.  Any concerns you have for your safety at home. Mental health Tell your health care provider about:  Any history of mental health conditions, including feelings of depression, sadness, or anxiety.  Any medicines that you take for a mental health condition. These include herbs and supplements. How do I know that I am pregnant? You may be pregnant if you have been sexually active and you miss your period. Other symptoms of early pregnancy include:  Mild cramping.  Very light vaginal bleeding (spotting).  Feeling more tired than usual.  Nausea and vomiting. These may be signs of morning sickness. Take a home pregnancy test if you have any of these symptoms. This test checks for a hormone in your urine called human chorionic gonadotropin, or hCG. A woman's body begins to make this hormone during early pregnancy. These tests are very accurate. Wait until at least the first day after you miss your period to take a   home pregnancy test. If the test shows that you are pregnant, call your health care provider for a prenatal care visit. What should I do if I become pregnant?  Schedule a visit with your health care provider as soon as you suspect you are pregnant.  Talk to your health care provider if you are taking  prescription medicines to determine if they are safe to take during pregnancy.  You may continue to have sex if it does not cause pain or other problems, such as vaginal bleeding. Follow these instructions at home: Eating and drinking  Follow instructions from your health care provider about eating or drinking restrictions.  Drink enough fluid to keep your urine pale yellow.  Eat a balanced diet. This includes fresh fruits and vegetables, whole grains, lean meats, low-fat dairy products, healthy fats, and foods that are high in fiber. Ask to meet with a nutritionist or registered dietitian for help with meal planning and goals.  Avoid eating raw or undercooked meat and seafood.  Avoid eating or drinking unpasteurized dairy products.   Lifestyle  Get regular exercise. Try to be active for at least 30 minutes a day on most days of the week. Ask your health care provider which activities are safe during pregnancy.  Maintain a healthy weight.  Avoid toxic fumes and chemicals.  Avoid cleaning cat litter boxes. Cat feces may contain a harmful parasite called toxoplasma.  Avoid travel to countries where Zika virus is common.  Do not use any products that contain nicotine or tobacco, such as cigarettes, e-cigarettes, and chewing tobacco. If you need help quitting, ask your health care provider.  Do not drink alcohol or use drugs.      General instructions  Keep an accurate record of your menstrual periods. This makes it easier for your health care provider to determine your baby's due date.  Take over-the-counter and prescription medicines only as told by your health care provider.  Begin taking prenatal vitamins and folic acid supplements daily as directed.  Manage any chronic conditions, such as hypertension and diabetes, as told by your health care provider. This is important. Summary  If you are planning to become pregnant, talk to your health care provider about preconception  care. This is an important part of planning for a healthy pregnancy.  Women of childbearing age should take 400 mcg of folic acid daily in addition to eating a diet rich in folic acid. This will prevent certain birth defects.  Schedule a visit with your health care provider as soon as you suspect you are pregnant. Tell your health care provider about your medical history, lifestyle activities, home safety, and other things that may concern you. This information is not intended to replace advice given to you by your health care provider. Make sure you discuss any questions you have with your health care provider. Document Revised: 05/04/2019 Document Reviewed: 05/04/2019 Elsevier Patient Education  2021 Elsevier Inc.  

## 2020-10-09 LAB — BETA HCG QUANT (REF LAB): hCG Quant: 56 m[IU]/mL

## 2020-10-15 ENCOUNTER — Telehealth: Payer: Self-pay | Admitting: Licensed Clinical Social Worker

## 2020-10-15 ENCOUNTER — Encounter: Payer: BC Managed Care – PPO | Admitting: Licensed Clinical Social Worker

## 2020-10-15 NOTE — Telephone Encounter (Signed)
Called pt regarding scheduled visit. Unable to leave message.

## 2020-11-08 ENCOUNTER — Other Ambulatory Visit: Payer: Self-pay

## 2020-11-08 ENCOUNTER — Ambulatory Visit (INDEPENDENT_AMBULATORY_CARE_PROVIDER_SITE_OTHER): Payer: BC Managed Care – PPO | Admitting: Obstetrics and Gynecology

## 2020-11-08 ENCOUNTER — Encounter: Payer: Self-pay | Admitting: Obstetrics and Gynecology

## 2020-11-08 VITALS — BP 109/73 | HR 65 | Ht 63.0 in | Wt 179.0 lb

## 2020-11-08 DIAGNOSIS — O039 Complete or unspecified spontaneous abortion without complication: Secondary | ICD-10-CM | POA: Diagnosis not present

## 2020-11-08 NOTE — Progress Notes (Signed)
   History:  Ms. Stephanie [name] is a 25 y.o. G1P0 who presents to clinic today for follow up from miscarriage.   Seen in MAU on 09/21/20 and delivered intact gestational sac at [redacted]w[redacted]d while in MAU.  She had hcg on 09/21/20 of 37,452 which was followed and dropped on 10/11/20 to 186. Seen again on 2/21 for follow up and had repeat HCG of  56. She resumed a  Normal menstrual cycle on 10/14/20. However about one week later had a few days of spotting. Along with this she had had some lower pelvic cramping and some side cramps. Also endorses bloating. Thinks she had some blood in urine. Denies any large clots, is not soaking through a pad with spotting. No dysuria.   Reports having normal periods typically without breakthrough bleeding. This was a desired pregnancy and she says they have resumed trying for pregnancy again.      The following portions of the patient's history were reviewed and updated as appropriate: allergies, current medications, family history, past medical history, social history, past surgical history and problem list.  Review of Systems:  Review of Systems  Constitutional: Negative for chills and fever.  Cardiovascular: Negative for chest pain.  Genitourinary: Negative for dysuria and urgency.  Musculoskeletal: Negative for back pain.  Neurological: Negative for dizziness.      Objective:  Physical Exam LMP 07/07/2020  Physical Exam Vitals and nursing note reviewed.  Constitutional:      Appearance: Normal appearance.  Abdominal:     General: Bowel sounds are normal. There is no distension.     Palpations: Abdomen is soft. There is no mass.     Tenderness: There is no abdominal tenderness. There is no guarding.     Comments: No CVA tenderness   Neurological:     Mental Status: She is alert.  Psychiatric:        Mood and Affect: Mood normal.        Behavior: Behavior normal.       Labs and Imaging No results found for this or any previous visit (from the past  24 hour(s)).  No results found.   Assessment & Plan:  1. SAB (spontaneous abortion) -suspect this is overall normal sequela s/p SAB, however will r/o UTI or possible new pregnancy. -provided emotional support s/p miscarriage  -discussed return precautions such as increased heavy bleeding, fevers, chills, etc   -UA, b-HCG    Gita Kudo, MD 11/08/2020 8:18 AM    Casper Harrison, MD OB Family Medicine Fellow, [facility] for [facility], [facility]

## 2020-11-08 NOTE — Progress Notes (Signed)
GYN presents for irregular bleeding LMP 10/14/20 for 8 days then 1 week later she spotted for 2 days. She side pain 3/10 x 2 days. Patient had a SAB 09/21/20

## 2020-11-08 NOTE — Patient Instructions (Signed)
Managing Pregnancy Loss Pregnancy loss can happen any time during a pregnancy. Often the cause is not known. It is rarely because of anything you did. Pregnancy loss in early pregnancy (during the first trimester) is called a miscarriage. This type of pregnancy loss is the most common. Pregnancy loss that happens after 20 weeks of pregnancy is called fetal demise if the baby's heart stops beating before birth. Fetal demise is much less common. Some women experience spontaneous labor shortly after fetal demise resulting in a stillborn birth (stillbirth). Any pregnancy loss can be devastating. You will need to recover both physically and emotionally. Most women are able to get pregnant again after a pregnancy loss and deliver a healthy baby. How to manage emotional recovery Pregnancy loss is very hard emotionally. You may feel many different emotions while you grieve. You may feel sad and angry. You may also feel guilty. It is normal to have periods of crying. Emotional recovery can take longer than physical recovery. It is different for everyone. Taking these steps can help you in managing this loss:  Remember that it is unlikely you did anything to cause the pregnancy loss.  Share your thoughts and feelings with friends, family, and your partner. Remember that your partner is also recovering emotionally.  Make sure you have a good support system. Do not spend too much time alone.  Meet with a pregnancy loss counselor or join a pregnancy loss support group.  Get enough sleep and eat a healthy diet. Return to regular exercise when you have recovered physically.  Do not use drugs or alcohol to manage your emotions.  Consider seeing a mental health professional to help you recover emotionally.  Ask a friend or loved one to help you decide what to do with any clothing and nursery items you received for your baby. In the case of a stillbirth, many women benefit from taking additional steps in the  grieving process. You may want to:  Hold your baby after the birth.  Name your baby.  Request a birth certificate.  Create a keepsake such as handprints or footprints.  Dress your baby and have a picture taken.  Make funeral arrangements.  Ask for a baptism or blessing. Hospitals have staff members who can help you with all these arrangements.   How to recognize emotional stress It is normal to have emotional stress after a pregnancy loss. But emotional stress that lasts a long time or becomes severe requires treatment. Watch out for these signs of severe emotional stress:  Sadness, anger, or guilt that is not going away and is interfering with your normal activities.  Relationship problems that have occurred or gotten worse since the pregnancy loss.  Signs of depression that last longer than 2 weeks. These may include: ? Sadness. ? Anxiety. ? Hopelessness. ? Loss of interest in activities you enjoy. ? Inability to concentrate. ? Trouble sleeping or sleeping too much. ? Loss of appetite or overeating. ? Thoughts of death or of hurting yourself. Follow these instructions at home:  Take over-the-counter and prescription medicines only as told by your health care provider.  Rest at home until your energy level returns. Return to your normal activities as told by your health care provider. Ask your health care provider what activities are safe for you.  When you are ready, meet with your health care provider to discuss steps to take for a future pregnancy.  Keep all follow-up visits as told by your health care provider. This is   important. Where to find support  To help you and your partner with the process of grieving, talk with your health care provider or seek counseling.  Consider meeting with others who have experienced pregnancy loss. Ask your health care provider about support groups and resources. Where to find more information  [place] Department of Health and Human  Services Office on Women's Health: www.womenshealth.gov  American Pregnancy Association: www.americanpregnancy.org Contact a health care provider if:  You continue to experience grief, sadness, or lack of motivation for everyday activities, and those feelings do not improve over time.  You are struggling to recover emotionally, especially if you are using alcohol or substances to help. Get help right away if:  You have thoughts of hurting yourself or others. If you ever feel like you may hurt yourself or others, or have thoughts about taking your own life, get help right away. You can go to your nearest emergency department or call:  Your local emergency services (911 in the [place]  A suicide crisis helpline, such as the National Suicide Prevention Lifeline at 1-800-273-8255. This is open 24 hours a day. Summary  Any pregnancy loss can be difficult physically and emotionally.  You may experience many different emotions while you grieve. Emotional recovery can last longer than physical recovery.  It is normal to have emotional stress after a pregnancy loss. But emotional stress that lasts a long time or becomes severe requires treatment.  See your health care provider if you are struggling emotionally after a pregnancy loss. This information is not intended to replace advice given to you by your health care provider. Make sure you discuss any questions you have with your health care provider. Document Revised: 11/24/2018 Document Reviewed: 10/15/2017 Elsevier Patient Education  2021 Elsevier Inc.  

## 2020-11-08 NOTE — Addendum Note (Signed)
Addended byCasper Harrison on: 11/08/2020 08:54 AM   Modules accepted: Orders

## 2020-11-09 LAB — URINALYSIS, ROUTINE W REFLEX MICROSCOPIC
Bilirubin, UA: NEGATIVE
Glucose, UA: NEGATIVE
Ketones, UA: NEGATIVE
Leukocytes,UA: NEGATIVE
Nitrite, UA: NEGATIVE
Protein,UA: NEGATIVE
RBC, UA: NEGATIVE
Specific Gravity, UA: 1.025 (ref 1.005–1.030)
Urobilinogen, Ur: 0.2 mg/dL (ref 0.2–1.0)
pH, UA: 5.5 (ref 5.0–7.5)

## 2020-11-09 LAB — BETA HCG QUANT (REF LAB): hCG Quant: 5 m[IU]/mL

## 2020-11-19 ENCOUNTER — Other Ambulatory Visit: Payer: Self-pay

## 2020-11-19 ENCOUNTER — Ambulatory Visit (INDEPENDENT_AMBULATORY_CARE_PROVIDER_SITE_OTHER): Payer: BC Managed Care – PPO | Admitting: Obstetrics and Gynecology

## 2020-11-19 ENCOUNTER — Encounter: Payer: Self-pay | Admitting: Obstetrics and Gynecology

## 2020-11-19 VITALS — BP 111/67 | Ht 63.0 in | Wt 182.0 lb

## 2020-11-19 DIAGNOSIS — R102 Pelvic and perineal pain: Secondary | ICD-10-CM

## 2020-11-19 LAB — POCT URINE PREGNANCY: Preg Test, Ur: NEGATIVE

## 2020-11-19 NOTE — Progress Notes (Signed)
Pt is having pain and cramping in the lower pelvis like she is going to have her period, 6/-7/10. LMP: 10/14/20. States she is having bloating. States she is having some constipation. Bladder is doing well. Drinking water. Recent pregnancy loss in February. No current bleeding at this moment.

## 2020-11-19 NOTE — Progress Notes (Signed)
25 yo P0010 s/p spontanoeus miscarriage in February presenting today for the evaluation of occasional cramping pain, bloating and lower abdominal pain alternating right and left for the past few days. Patient is actively trying to conceive and is very anxious. Patient did not have a true period in February. She has not had a period in March.   No past medical history on file.  No past surgical history on file. Family History  Problem Relation Age of Onset  . Asthma Brother   . Diabetes Maternal Grandmother   . Migraines Mother    Social History   Tobacco Use  . Smoking status: Never Smoker  . Smokeless tobacco: Never Used  Vaping Use  . Vaping Use: Never used  Substance Use Topics  . Drug use: Never   ROS See pertinent in HPI. All other systems reviewed and non contributory Blood pressure 111/67, height 5\' 3"  (1.6 m), weight 182 lb (82.6 kg), last menstrual period 10/14/2020, unknown if currently breastfeeding.  GENERAL: Well-developed, well-nourished female in no acute distress.  ABDOMEN: Soft, nontender, nondistended. No organomegaly. PELVIC: Declined EXTREMITIES: No cyanosis, clubbing, or edema, 2+ distal pulses.  11/19/20 UPT- neg  A/P 25 yo with pre-menstrual symptoms - Reassurance provided - Patient declined wet prep - Patient initially requested quant HCG to confirm negative result but later changed her mind - Discussed determination of ovulation date with ovulation predictor kits and calendar method - Partner present during the visit - All questions answered

## 2020-12-27 ENCOUNTER — Other Ambulatory Visit: Payer: Self-pay

## 2020-12-27 ENCOUNTER — Ambulatory Visit (INDEPENDENT_AMBULATORY_CARE_PROVIDER_SITE_OTHER): Payer: BC Managed Care – PPO

## 2020-12-27 DIAGNOSIS — N912 Amenorrhea, unspecified: Secondary | ICD-10-CM | POA: Diagnosis not present

## 2020-12-27 LAB — POCT URINE PREGNANCY: Preg Test, Ur: POSITIVE — AB

## 2020-12-27 NOTE — Progress Notes (Signed)
Patient was assessed and managed by nursing staff during this encounter. I have reviewed the chart and agree with the documentation and plan. I have also made any necessary editorial changes.  Catalina Antigua, MD 12/27/2020 11:41 AM

## 2020-12-27 NOTE — Progress Notes (Signed)
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 She has no unusual complaints. LMP: 11/21/20    OBJECTIVE: Appears well, in no apparent distress.  OB History    Gravida  2   Para      Term      Preterm      AB  1   Living        SAB  1   IAB      Ectopic      Multiple      Live Births             Home UPT Result:Positive  In-Office UPT result:Positive  I have reviewed the patient's medical, obstetrical, social, and family histories, and medications.   ASSESSMENT: Positive pregnancy test  PLAN Prenatal care to be completed at:  [facility] at [facility]

## 2020-12-27 NOTE — Patient Instructions (Signed)
https://www.cdc.gov/pregnancy/infections.html">  First Trimester of Pregnancy  The first trimester of pregnancy starts on the first day of your last menstrual period until the end of week 12. This is also called months 1 through 3 of pregnancy. Body changes during your first trimester Your body goes through many changes during pregnancy. The changes usually return to normal after your baby is born. Physical changes  You may gain or lose weight.  Your breasts may grow larger and hurt. The area around your nipples may get darker.  Dark spots or blotches may develop on your face.  You may have changes in your hair. Health changes  You may feel like you might vomit (nauseous), and you may vomit.  You may have heartburn.  You may have headaches.  You may have trouble pooping (constipation).  Your gums may bleed. Other changes  You may get tired easily.  You may pee (urinate) more often.  Your menstrual periods will stop.  You may not feel hungry.  You may want to eat certain kinds of food.  You may have changes in your emotions from day to day.  You may have more dreams. Follow these instructions at home: Medicines  Take over-the-counter and prescription medicines only as told by your doctor. Some medicines are not safe during pregnancy.  Take a prenatal vitamin that contains at least 600 micrograms (mcg) of folic acid. Eating and drinking  Eat healthy meals that include: ? Fresh fruits and vegetables. ? Whole grains. ? Good sources of protein, such as meat, eggs, or tofu. ? Low-fat dairy products.  Avoid raw meat and unpasteurized juice, milk, and cheese.  If you feel like you may vomit, or you vomit: ? Eat 4 or 5 small meals a day instead of 3 large meals. ? Try eating a few soda crackers. ? Drink liquids between meals instead of during meals.  You may need to take these actions to prevent or treat trouble pooping: ? Drink enough fluids to keep your pee  (urine) pale yellow. ? Eat foods that are high in fiber. These include beans, whole grains, and fresh fruits and vegetables. ? Limit foods that are high in fat and sugar. These include fried or sweet foods. Activity  Exercise only as told by your doctor. Most people can do their usual exercise routine during pregnancy.  Stop exercising if you have cramps or pain in your lower belly (abdomen) or low back.  Do not exercise if it is too hot or too humid, or if you are in a place of great height (high altitude).  Avoid heavy lifting.  If you choose to, you may have sex unless your doctor tells you not to. Relieving pain and discomfort  Wear a good support bra if your breasts are sore.  Rest with your legs raised (elevated) if you have leg cramps or low back pain.  If you have bulging veins (varicose veins) in your legs: ? Wear support hose as told by your doctor. ? Raise your feet for 15 minutes, 3-4 times a day. ? Limit salt in your food. Safety  Wear your seat belt at all times when you are in a car.  Talk with your doctor if someone is hurting you or yelling at you.  Talk with your doctor if you are feeling sad or have thoughts of hurting yourself. Lifestyle  Do not use hot tubs, steam rooms, or saunas.  Do not douche. Do not use tampons or scented sanitary pads.  Do not   use herbal medicines, illegal drugs, or medicines that are not approved by your doctor. Do not drink alcohol.  Do not smoke or use any products that contain nicotine or tobacco. If you need help quitting, ask your doctor.  Avoid cat litter boxes and soil that is used by cats. These carry germs that can cause harm to the baby and can cause a loss of your baby by miscarriage or stillbirth. General instructions  Keep all follow-up visits. This is important.  Ask for help if you need counseling or if you need help with nutrition. Your doctor can give you advice or tell you where to go for help.  Visit your  dentist. At home, brush your teeth with a soft toothbrush. Floss gently.  Write down your questions. Take them to your prenatal visits. Where to find more information  American Pregnancy Association: americanpregnancy.org  American College of Obstetricians and Gynecologists: www.acog.org  Office on Women's Health: womenshealth.gov/pregnancy Contact a doctor if:  You are dizzy.  You have a fever.  You have mild cramps or pressure in your lower belly.  You have a nagging pain in your belly area.  You continue to feel like you may vomit, you vomit, or you have watery poop (diarrhea) for 24 hours or longer.  You have a bad-smelling fluid coming from your vagina.  You have pain when you pee.  You are exposed to a disease that spreads from person to person, such as chickenpox, measles, Zika virus, HIV, or hepatitis. Get help right away if:  You have spotting or bleeding from your vagina.  You have very bad belly cramping or pain.  You have shortness of breath or chest pain.  You have any kind of injury, such as from a fall or a car crash.  You have new or increased pain, swelling, or redness in an arm or leg. Summary  The first trimester of pregnancy starts on the first day of your last menstrual period until the end of week 12 (months 1 through 3).  Eat 4 or 5 small meals a day instead of 3 large meals.  Do not smoke or use any products that contain nicotine or tobacco. If you need help quitting, ask your doctor.  Keep all follow-up visits. This information is not intended to replace advice given to you by your health care provider. Make sure you discuss any questions you have with your health care provider. Document Revised: 01/11/2020 Document Reviewed: 11/17/2019 Elsevier Patient Education  2021 Elsevier Inc.  

## 2020-12-31 ENCOUNTER — Telehealth: Payer: Self-pay

## 2020-12-31 NOTE — Telephone Encounter (Signed)
Pt [redacted]w[redacted]d c/o cramping yesterday, denies VB.  She took Tylenol and has not experienced anymore cramping.  If sx's worsen or if experiences vaginal bleeding, contact the office.

## 2021-01-10 ENCOUNTER — Telehealth: Payer: Self-pay

## 2021-01-10 DIAGNOSIS — O219 Vomiting of pregnancy, unspecified: Secondary | ICD-10-CM

## 2021-01-10 NOTE — Telephone Encounter (Signed)
Contacted pt to discuss paperwork. No answer, vm is full

## 2021-01-11 MED ORDER — PROMETHAZINE HCL 25 MG PO TABS: 25.0000 mg | ORAL_TABLET | Freq: Four times a day (QID) | ORAL | 0 refills | Status: DC | PRN

## 2021-01-17 ENCOUNTER — Ambulatory Visit (INDEPENDENT_AMBULATORY_CARE_PROVIDER_SITE_OTHER): Payer: BC Managed Care – PPO

## 2021-01-17 ENCOUNTER — Other Ambulatory Visit: Payer: Self-pay

## 2021-01-17 VITALS — BP 104/69 | HR 67 | Ht 63.0 in | Wt 181.4 lb

## 2021-01-17 DIAGNOSIS — Z3481 Encounter for supervision of other normal pregnancy, first trimester: Secondary | ICD-10-CM | POA: Diagnosis not present

## 2021-01-17 DIAGNOSIS — O3680X0 Pregnancy with inconclusive fetal viability, not applicable or unspecified: Secondary | ICD-10-CM

## 2021-01-17 DIAGNOSIS — Z3480 Encounter for supervision of other normal pregnancy, unspecified trimester: Secondary | ICD-10-CM | POA: Insufficient documentation

## 2021-01-17 DIAGNOSIS — Z3491 Encounter for supervision of normal pregnancy, unspecified, first trimester: Secondary | ICD-10-CM | POA: Insufficient documentation

## 2021-01-17 NOTE — Progress Notes (Signed)
New OB Intake  I connected with  Stephanie [name] on 01/17/21 at  8:15 AM EDT by in office and verified that I am speaking with the correct person using two identifiers. Nurse is located at [facility] and pt is located at [place].  I discussed the limitations, risks, security and privacy concerns of performing an evaluation and management service by telephone and the availability of in person appointments. I also discussed with the patient that there may be a patient responsible charge related to this service. The patient expressed understanding and agreed to proceed.  I explained I am completing New OB Intake today. We discussed her EDD of 08/28/21 that is based on LMP of 11/21/20. Pt is G2/P0. I reviewed her allergies, medications, Medical/Surgical/OB history, and appropriate screenings. I informed her of [facility] services. Based on history, this is a/an uncomplicated pregnancy.  Patient Active Problem List   Diagnosis Date Noted  . Miscarriage within last 12 months 10/08/2020  . BMI (body mass index), pediatric, 85% to less than 95% for age 66/16/2015  . Learning problem 12/31/2013    Concerns addressed today  Delivery Plans:  Plans to deliver at [facility].   MyChart/Babyscripts MyChart access verified. I explained pt will have some visits in office and some virtually. Babyscripts instructions given and order placed. Patient verifies receipt of registration text/e-mail. Account successfully created and app downloaded.  Blood Pressure Cuff Patient has private insurance; instructed to purchase blood pressure cuff and bring to first prenatal appt. Explained after first prenatal appt pt will check weekly and document in Babyscripts.  Anatomy US Explained first scheduled [place] will be around 19 weeks. Dating and viability scan performed today.  Labs Discussed Stephanie [name] genetic screening with patient. Would like both Panorama and Horizon drawn at new OB visit. Routine prenatal labs needed.  Covid  Vaccine Patient has not covid vaccine.   Social Determinants of Health . Food Insecurity: Patient denies food insecurity. . WIC Referral: Patient is interested in referral to [facility].  . Transportation: Patient denies transportation needs. . Childcare: Discussed no children allowed at ultrasound appointments. Offered childcare services; patient declines childcare services at this time.  First visit review I reviewed new OB appt with pt. I explained she will have a pelvic exam, ob bloodwork with genetic screening, and PAP smear. Explained pt will be seen by Stephanie [name] at first visit; encounter routed to appropriate provider. Explained that patient will be seen by pregnancy navigator following visit with provider.  Stephanie Capri, RN 01/17/2021  8:18 AM

## 2021-01-21 ENCOUNTER — Other Ambulatory Visit: Payer: Self-pay

## 2021-01-25 ENCOUNTER — Other Ambulatory Visit (HOSPITAL_COMMUNITY)
Admission: RE | Admit: 2021-01-25 | Discharge: 2021-01-25 | Disposition: A | Discharge status: Home / Self Care | Payer: BC Managed Care – PPO | Source: Ambulatory Visit | Attending: Family Medicine | Admitting: Family Medicine

## 2021-01-25 ENCOUNTER — Ambulatory Visit (INDEPENDENT_AMBULATORY_CARE_PROVIDER_SITE_OTHER): Payer: BC Managed Care – PPO | Admitting: Family Medicine

## 2021-01-25 ENCOUNTER — Encounter: Payer: Self-pay | Admitting: Family Medicine

## 2021-01-25 ENCOUNTER — Other Ambulatory Visit: Payer: Self-pay

## 2021-01-25 VITALS — BP 109/72 | HR 79 | Wt 186.1 lb

## 2021-01-25 DIAGNOSIS — Z3481 Encounter for supervision of other normal pregnancy, first trimester: Secondary | ICD-10-CM | POA: Insufficient documentation

## 2021-01-25 DIAGNOSIS — Z31430 Encounter of female for testing for genetic disease carrier status for procreative management: Secondary | ICD-10-CM | POA: Diagnosis not present

## 2021-01-25 MED ORDER — POLYETHYLENE GLYCOL 3350 17 GM/SCOOP PO POWD: 17.0000 g | Freq: Every day | ORAL | 1 refills | Status: DC | PRN

## 2021-01-25 NOTE — Progress Notes (Signed)
Pt presents for initial NOB Normal pap completed at [facility] on 09/12/20

## 2021-01-25 NOTE — Patient Instructions (Signed)
https://www.cdc.gov/pregnancy/infections.html">  First Trimester of Pregnancy  The first trimester of pregnancy starts on the first day of your last menstrual period until the end of week 12. This is also called months 1 through 3 ofpregnancy. Body changes during your first trimester Your body goes through many changes during pregnancy. The changes usuallyreturn to normal after your baby is born. Physical changes You may gain or lose weight. Your breasts may grow larger and hurt. The area around your nipples may get darker. Dark spots or blotches may develop on your face. You may have changes in your hair. Health changes You may feel like you might vomit (nauseous), and you may vomit. You may have heartburn. You may have headaches. You may have trouble pooping (constipation). Your gums may bleed. Other changes You may get tired easily. You may pee (urinate) more often. Your menstrual periods will stop. You may not feel hungry. You may want to eat certain kinds of food. You may have changes in your emotions from day to day. You may have more dreams. Follow these instructions at home: Medicines Take over-the-counter and prescription medicines only as told by your doctor. Some medicines are not safe during pregnancy. Take a prenatal vitamin that contains at least 600 micrograms (mcg) of folic acid. Eating and drinking Eat healthy meals that include: Fresh fruits and vegetables. Whole grains. Good sources of protein, such as meat, eggs, or tofu. Low-fat dairy products. Avoid raw meat and unpasteurized juice, milk, and cheese. If you feel like you may vomit, or you vomit: Eat 4 or 5 small meals a day instead of 3 large meals. Try eating a few soda crackers. Drink liquids between meals instead of during meals. You may need to take these actions to prevent or treat trouble pooping: Drink enough fluids to keep your pee (urine) pale yellow. Eat foods that are high in fiber. These  include beans, whole grains, and fresh fruits and vegetables. Limit foods that are high in fat and sugar. These include fried or sweet foods. Activity Exercise only as told by your doctor. Most people can do their usual exercise routine during pregnancy. Stop exercising if you have cramps or pain in your lower belly (abdomen) or low back. Do not exercise if it is too hot or too humid, or if you are in a place of great height (high altitude). Avoid heavy lifting. If you choose to, you may have sex unless your doctor tells you not to. Relieving pain and discomfort Wear a good support bra if your breasts are sore. Rest with your legs raised (elevated) if you have leg cramps or low back pain. If you have bulging veins (varicose veins) in your legs: Wear support hose as told by your doctor. Raise your feet for 15 minutes, 3-4 times a day. Limit salt in your food. Safety Wear your seat belt at all times when you are in a car. Talk with your doctor if someone is hurting you or yelling at you. Talk with your doctor if you are feeling sad or have thoughts of hurting yourself. Lifestyle Do not use hot tubs, steam rooms, or saunas. Do not douche. Do not use tampons or scented sanitary pads. Do not use herbal medicines, illegal drugs, or medicines that are not approved by your doctor. Do not drink alcohol. Do not smoke or use any products that contain nicotine or tobacco. If you need help quitting, ask your doctor. Avoid cat litter boxes and soil that is used by cats. These carry   germs that can cause harm to the baby and can cause a loss of your baby by miscarriage or stillbirth. General instructions Keep all follow-up visits. This is important. Ask for help if you need counseling or if you need help with nutrition. Your doctor can give you advice or tell you where to go for help. Visit your dentist. At home, brush your teeth with a soft toothbrush. Floss gently. Write down your questions. Take them  to your prenatal visits. Where to find more information American Pregnancy Association: americanpregnancy.org [employer]: www.acog.org Office on Women's Health: MightyReward.co.nz Contact a doctor if: You are dizzy. You have a fever. You have mild cramps or pressure in your lower belly. You have a nagging pain in your belly area. You continue to feel like you may vomit, you vomit, or you have watery poop (diarrhea) for 24 hours or longer. You have a bad-smelling fluid coming from your vagina. You have pain when you pee. You are exposed to a disease that spreads from person to person, such as chickenpox, measles, Zika virus, HIV, or hepatitis. Get help right away if: You have spotting or bleeding from your vagina. You have very bad belly cramping or pain. You have shortness of breath or chest pain. You have any kind of injury, such as from a fall or a car crash. You have new or increased pain, swelling, or redness in an arm or leg. Summary The first trimester of pregnancy starts on the first day of your last menstrual period until the end of week 12 (months 1 through 3). Eat 4 or 5 small meals a day instead of 3 large meals. Do not smoke or use any products that contain nicotine or tobacco. If you need help quitting, ask your doctor. Keep all follow-up visits. This information is not intended to replace advice given to you by your health care provider. Make sure you discuss any questions you have with your healthcare provider. Document Revised: 01/11/2020 Document Reviewed: 11/17/2019 Elsevier Patient Education  2022 [employer].   Contraception Choices Contraception refers to things you do or use to prevent pregnancy. It is also called birth control. There are several methods of birth control. Talk to yourdoctor about the best method for you. Hormonal birth control This kind of birth control uses hormones. Here are some types of  hormonal birth control: A tube that is put under the skin of your arm (implant). The tube can stay in for up to 3 years. Shots you get every 3 months. Pills you take every day. A patch you change 1 time each week for 3 weeks. After that, the patch is taken off for 1 week. A ring you put in the vagina. The ring is left in for 3 weeks. Then it is taken out of the vagina for 1 week. Then a new ring is put in. Pills you take after unprotected sex. These are called emergency birth control pills. Barrier birth control Here are some types of barrier birth control: A thin covering that is put on the penis before sex (female condom). The covering is thrown away after sex. A soft, loose covering that is put in the vagina before sex (female condom). The covering is thrown away after sex. A rubber bowl that sits over the cervix (diaphragm). The bowl must be made for you. The bowl is put into the vagina before sex. The bowl is left in for 6-8 hours after sex. It is taken out within  24 hours. A small, soft cup that fits over the cervix (cervical cap). The cup must be made for you. The cup should be left in for 6-8 hours after sex. It is taken out within 48 hours. A sponge that is put into the vagina before sex. It must be left in for at least 6 hours after sex. It must be taken out within 30 hours and thrown away. A chemical that kills or stops sperm from getting into the womb (uterus). This chemical is called a spermicide. It may be a pill, cream, jelly, or foam to put in the vagina. The chemical should be used at least 10-15 minutes before sex. IUD birth control IUD means "intrauterine device." It is put inside the womb. There are two kinds: Hormone IUD. This kind can stay in the womb for 3-5 years. Copper IUD. This kind can stay in the womb for 10 years. Permanent birth control Here are some types of permanent birth control: Surgery to block the fallopian tubes. Having an insert put into each fallopian  tube. This method takes 3 months to work. Other forms of birth control must be used for 3 months. Surgery to tie off the tubes that carry sperm in men (vasectomy). This method takes 3 months to work. Other forms of birth control must be used for 3 months. Natural planning birth control Here are some types of natural planning birth control: Not having sex on the days the woman could get pregnant. Using a calendar: To keep track of the length of each menstrual cycle. To find out what days pregnancy can happen. To plan to not have sex on days when pregnancy can happen. Watching for signs of ovulation and not having sex during this time. One way the woman can check for ovulation is to check her temperature. Waiting to have sex until after ovulation. Where to find more information Centers for Disease Control and Prevention: FootballExhibition.com.br Summary Contraception, also called birth control, refers to things you do or use to prevent pregnancy. Hormonal methods of birth control include implants, injections, pills, patches, vaginal rings, and emergency birth control pills. Barrier methods of birth control can include female condoms, female condoms, diaphragms, cervical caps, sponges, and spermicides. There are two types of IUD (intrauterine device) birth control. An IUD can be put in a woman's womb to prevent pregnancy for several years. Permanent birth control can be done through a procedure for males, females, or both. Natural planning means not having sex when the woman could get pregnant. This information is not intended to replace advice given to you by your health care provider. Make sure you discuss any questions you have with your healthcare provider. Document Revised: 01/09/2020 Document Reviewed: 01/09/2020 Elsevier Patient Education  2022 [employer].

## 2021-01-25 NOTE — Progress Notes (Signed)
     Subjective:   Stephanie [name] is a 25 y.o. G2P0010 at [redacted]w[redacted]d by early ultrasound being seen today for her first obstetrical visit.  Her obstetrical history is significant for obesity. Patient does intend to breast feed. Pregnancy history fully reviewed.  Patient reports nausea.  HISTORY: OB History  Gravida Para Term Preterm AB Living  2 0 0 0 1 0  SAB IAB Ectopic Multiple Live Births  1 0 0 0 0    # Outcome Date GA Lbr Len/2nd Weight Sex Delivery Anes PTL Lv  2 Current           1 SAB            Last pap smear was  09/12/2020 at [facility] and was  reportedly normal, we will send ROI History reviewed. No pertinent past medical history. History reviewed. No pertinent surgical history. Family History  Problem Relation Age of Onset   Asthma Brother    Diabetes Maternal Grandmother    Migraines Mother    Social History   Tobacco Use   Smoking status: Never   Smokeless tobacco: Never  Vaping Use   Vaping Use: Never used  Substance Use Topics   Alcohol use: Not Currently   Drug use: Never   No Known Allergies Current Outpatient Medications on File Prior to Visit  Medication Sig Dispense Refill   Acetaminophen (TYLENOL EXTRA STRENGTH PO) Take by mouth.     Prenatal Vit-Fe Fumarate-FA (PRENATAL MULTIVITAMIN) TABS tablet Take 1 tablet by mouth daily at 12 noon.     promethazine (PHENERGAN) 25 MG tablet Take 1 tablet (25 mg total) by mouth every 6 (six) hours as needed for nausea or vomiting. 30 tablet 0   No current facility-administered medications on file prior to visit.     Exam   Vitals:   01/25/21 0917  BP: 109/72  Pulse: 79  Weight: 186 lb 1.6 oz (84.4 kg)   Fetal Heart Rate (bpm): +u/s 165  System: General: well-developed, well-nourished female in no acute distress   Skin: normal coloration and turgor, no rashes   Neurologic: oriented, normal, negative, normal mood   Extremities: normal strength, tone, and muscle mass, ROM of all joints is  normal   HEENT PERRLA, extraocular movement intact and sclera clear, anicteric   Neck supple and no masses   Respiratory:  no respiratory distress   Abdomen: soft, non-tender; bowel sounds normal; no masses,  no organomegaly     Assessment:   Pregnancy: G2P0010 Patient Active Problem List   Diagnosis Date Noted   Encounter for supervision of normal pregnancy in first trimester 01/17/2021   Miscarriage within last 12 months 10/08/2020   BMI (body mass index), pediatric, 85% to less than 95% for age 10/31/2013   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 Encounter for supervision of other normal pregnancy in first trimester Initial labs drawn. Continue prenatal vitamins. Genetic Screening discussed, NIPS: ordered. Ultrasound discussed; fetal anatomic survey: ordered. Problem list reviewed and updated. The nature of [facility] with multiple MDs and other Advanced Practice Providers was explained to patient; also emphasized that residents, students are part of our team.   Routine obstetric precautions reviewed. Return in about 4 weeks (around 02/22/2021) for [facility], ob visit.

## 2021-01-26 LAB — OBSTETRIC PANEL, INCLUDING HIV
Antibody Screen: NEGATIVE
Basophils Absolute: 0 10*3/uL (ref 0.0–0.2)
Basos: 0 %
EOS (ABSOLUTE): 0 10*3/uL (ref 0.0–0.4)
Eos: 0 %
HIV Screen 4th Generation wRfx: NONREACTIVE
Hematocrit: 42.3 % (ref 34.0–46.6)
Hemoglobin: 14.3 g/dL (ref 11.1–15.9)
Hepatitis B Surface Ag: NEGATIVE
Immature Grans (Abs): 0 10*3/uL (ref 0.0–0.1)
Immature Granulocytes: 0 %
Lymphocytes Absolute: 1.9 10*3/uL (ref 0.7–3.1)
Lymphs: 21 %
MCH: 28.6 pg (ref 26.6–33.0)
MCHC: 33.8 g/dL (ref 31.5–35.7)
MCV: 85 fL (ref 79–97)
Monocytes Absolute: 0.5 10*3/uL (ref 0.1–0.9)
Monocytes: 5 %
Neutrophils Absolute: 6.6 10*3/uL (ref 1.4–7.0)
Neutrophils: 74 %
Platelets: 349 10*3/uL (ref 150–450)
RBC: 5 x10E6/uL (ref 3.77–5.28)
RDW: 13.1 % (ref 11.7–15.4)
RPR Ser Ql: NONREACTIVE
Rh Factor: POSITIVE
Rubella Antibodies, IGG: 1.95 index (ref >0.99)
WBC: 9.1 10*3/uL (ref 3.4–10.8)

## 2021-01-26 LAB — HEMOGLOBIN A1C
Est. average glucose Bld gHb Est-mCnc: 105 mg/dL
Hgb A1c MFr Bld: 5.3 % (ref 4.8–5.6)

## 2021-01-26 LAB — HEPATITIS C ANTIBODY: Hep C Virus Ab: 0.1 s/co ratio (ref 0.0–0.9)

## 2021-01-27 LAB — URINE CULTURE, OB REFLEX

## 2021-01-27 LAB — CULTURE, OB URINE

## 2021-01-30 ENCOUNTER — Telehealth: Payer: Self-pay

## 2021-01-30 NOTE — Telephone Encounter (Signed)
I spoke with patient regarding an earlier appointment. I have explain to patient the process on how often we see patients doing the the course of pregnancy. She voice understanding and I made her aware we are always here to support her if needed.

## 2021-02-04 ENCOUNTER — Encounter: Payer: Self-pay | Admitting: Obstetrics and Gynecology

## 2021-02-05 LAB — URINE CYTOLOGY ANCILLARY ONLY
Bacterial Vaginitis-Urine: NEGATIVE
Candida Urine: NEGATIVE
Chlamydia: NEGATIVE
Comment: NEGATIVE
Comment: NEGATIVE
Comment: NORMAL
Neisseria Gonorrhea: NEGATIVE
Trichomonas: NEGATIVE

## 2021-02-06 ENCOUNTER — Other Ambulatory Visit: Payer: Self-pay

## 2021-02-06 DIAGNOSIS — O219 Vomiting of pregnancy, unspecified: Secondary | ICD-10-CM

## 2021-02-07 MED ORDER — PROMETHAZINE HCL 25 MG PO TABS: 25.0000 mg | ORAL_TABLET | Freq: Four times a day (QID) | ORAL | 0 refills | Status: DC | PRN

## 2021-02-22 ENCOUNTER — Ambulatory Visit (INDEPENDENT_AMBULATORY_CARE_PROVIDER_SITE_OTHER): Payer: BC Managed Care – PPO | Admitting: Nurse Practitioner

## 2021-02-22 ENCOUNTER — Encounter: Payer: Self-pay | Admitting: Nurse Practitioner

## 2021-02-22 ENCOUNTER — Other Ambulatory Visit: Payer: Self-pay

## 2021-02-22 ENCOUNTER — Other Ambulatory Visit (HOSPITAL_COMMUNITY)
Admission: RE | Admit: 2021-02-22 | Discharge: 2021-02-22 | Disposition: A | Discharge status: Home / Self Care | Payer: BC Managed Care – PPO | Source: Ambulatory Visit | Attending: Nurse Practitioner | Admitting: Nurse Practitioner

## 2021-02-22 VITALS — BP 117/69 | HR 66 | Wt 186.4 lb

## 2021-02-22 DIAGNOSIS — Z3A13 13 weeks gestation of pregnancy: Secondary | ICD-10-CM

## 2021-02-22 DIAGNOSIS — Z3481 Encounter for supervision of other normal pregnancy, first trimester: Secondary | ICD-10-CM | POA: Insufficient documentation

## 2021-02-22 DIAGNOSIS — Z6832 Body mass index (BMI) 32.0-32.9, adult: Secondary | ICD-10-CM

## 2021-02-22 DIAGNOSIS — O219 Vomiting of pregnancy, unspecified: Secondary | ICD-10-CM

## 2021-02-22 MED ORDER — DOXYLAMINE-PYRIDOXINE 10-10 MG PO TBEC: DELAYED_RELEASE_TABLET | ORAL | 2 refills | Status: DC

## 2021-02-22 NOTE — Patient Instructions (Signed)
Safe Medications in Pregnancy  ° °Acne: °Benzoyl Peroxide °Salicylic Acid ° °Backache/Headache: °Tylenol: 2 regular strength every 4 hours OR °             2 Extra strength every 6 hours ° °Colds/Coughs/Allergies: °Benadryl (alcohol free) 25 mg every 6 hours as needed °Breath right strips °Claritin °Cepacol throat lozenges °Chloraseptic throat spray °Cold-Eeze- up to three times per day °Cough drops, alcohol free °Flonase  °Guaifenesin °Mucinex °Robitussin DM (plain only, alcohol free) °Saline nasal spray/drops °Sudafed (pseudoephedrine) & Actifed ** use only after [redacted] weeks gestation and if you do not have high blood pressure °Tylenol °Vicks Vaporub °Zinc lozenges °Zyrtec  ° °Constipation: °Colace °Ducolax suppositories °Fleet enema °Glycerin suppositories °Metamucil °Milk of magnesia °Miralax °Senokot °Smooth move tea ° °Diarrhea: °Kaopectate °Imodium A-D ° °*NO pepto Bismol ° °Hemorrhoids: °Anusol °Anusol HC °Preparation H °Tucks ° °Indigestion: °Tums °Maalox °Mylanta °Zantac  °Pepcid ° °Insomnia: °Benadryl (alcohol free) 25mg every 6 hours as needed °Tylenol PM °Unisom, no Gelcaps ° °Leg Cramps: °Tums °MagGel ° °Nausea/Vomiting:  °Bonine °Dramamine °Emetrol °Ginger extract °Sea bands °Meclizine  °Nausea medication to take during pregnancy:  °Unisom (doxylamine succinate 25 mg tablets) Take one tablet daily at bedtime. If symptoms are not adequately controlled, the dose can be increased to a maximum recommended dose of two tablets daily (1/2 tablet in the morning, 1/2 tablet mid-afternoon and one at bedtime). °Vitamin B6 100mg tablets. Take one tablet twice a day (up to 200 mg per day). ° °Skin Rashes: °Aveeno products °Benadryl cream or 25mg every 6 hours as needed °Calamine Lotion °1% cortisone cream ° °Yeast infection: °Gyne-lotrimin 7 °Monistat 7 ° ° °**If taking multiple medications, please check labels to avoid duplicating the same active ingredients °**take medication as directed on the label °** Do not  exceed 4000 mg of tylenol in 24 hours °**Do not take medications that contain aspirin or ibuprofen ° °  °

## 2021-02-22 NOTE — Addendum Note (Signed)
Addended by: Harrel Lemon on: 02/22/2021 11:54 AM   Modules accepted: Orders

## 2021-02-22 NOTE — Progress Notes (Signed)
Reports discharge last week. Would like yeast/ bv screening swab. Does not want to do self swab.  Requesting U/S. Hx of SAB just under 13 wks with last pregnancy.

## 2021-02-22 NOTE — Progress Notes (Signed)
    Subjective:  Stephanie [name] is a 25 y.o. G2P0010 at [redacted]w[redacted]d being seen today for ongoing prenatal care.  She is currently monitored for the following issues for this low-risk pregnancy and has Learning problem; Miscarriage within last 12 months; Encounter for supervision of normal pregnancy in first trimester; and BMI 32.0-32.9,adult on their problem list.  Patient reports nausea.  Contractions: Not present. Vag. Bleeding: None.   . Denies leaking of fluid.   The following portions of the patient's history were reviewed and updated as appropriate: allergies, current medications, past family history, past medical history, past social history, past surgical history and problem list. Problem list updated.  Objective:   Vitals:   02/22/21 0838  BP: 117/69  Pulse: 66  Weight: 186 lb 6.4 oz (84.6 kg)    Fetal Status: Fetal Heart Rate (bpm): 152         General:  Alert, oriented and cooperative. Patient is in no acute distress.  Skin: Skin is warm and dry. No rash noted.   Cardiovascular: Normal heart rate noted  Respiratory: Normal respiratory effort, no problems with respiration noted  Abdomen: Soft, gravid, appropriate for gestational age. Pain/Pressure: Absent     Pelvic:  Cervical exam deferred        Extremities: Normal range of motion.  Edema: None  Mental Status: Normal mood and affect. Normal behavior. Normal judgment and thought content.   Urinalysis:      Assessment and Plan:  Pregnancy: G2P0010 at [redacted]w[redacted]d  1. Encounter for supervision of other normal pregnancy in first trimester ROI done for pap results done in January at [facility] Reviewed Babyscripts app and resources available there  - [place] MFM OB COMP + 14 WK; Future  2. BMI 32.0-32.9,adult  3. Nausea and vomiting during pregnancy Phenergan not working as well and causing drowsiness  - Doxylamine-Pyridoxine (DICLEGIS) 10-10 MG TBEC; Take 2 tablets at bedtime and one in the morning and one in the  afternoon as needed for nausea.  Dispense: 60 tablet; Refill: 2   Preterm labor symptoms and general obstetric precautions including but not limited to vaginal bleeding, contractions, leaking of fluid and fetal movement were reviewed in detail with the patient. Please refer to After Visit Summary for other counseling recommendations.  Return in about 4 weeks (around 03/22/2021) for in person ROB.  Nolene Bernheim, RN, MSN, NP-BC Nurse Practitioner, [facility] [employer], [facility] 02/22/2021 9:13 AM

## 2021-02-24 LAB — CERVICOVAGINAL ANCILLARY ONLY
Bacterial Vaginitis (gardnerella): NEGATIVE
Candida Glabrata: NEGATIVE
Candida Vaginitis: NEGATIVE
Comment: NEGATIVE
Comment: NEGATIVE
Comment: NEGATIVE

## 2021-03-22 ENCOUNTER — Ambulatory Visit (INDEPENDENT_AMBULATORY_CARE_PROVIDER_SITE_OTHER): Payer: BC Managed Care – PPO | Admitting: Obstetrics

## 2021-03-22 ENCOUNTER — Other Ambulatory Visit: Payer: Self-pay

## 2021-03-22 ENCOUNTER — Encounter: Payer: Self-pay | Admitting: Obstetrics

## 2021-03-22 VITALS — BP 120/73 | HR 72 | Wt 188.0 lb

## 2021-03-22 DIAGNOSIS — Z3481 Encounter for supervision of other normal pregnancy, first trimester: Secondary | ICD-10-CM

## 2021-03-22 NOTE — Progress Notes (Signed)
Subjective:  Stephanie [name] is a 25 y.o. G2P0010 at [redacted]w[redacted]d being seen today for ongoing prenatal care.  She is currently monitored for the following issues for this low-risk pregnancy and has Learning problem; Miscarriage within last 12 months; Encounter for supervision of normal pregnancy in first trimester; and BMI 32.0-32.9,adult on their problem list.  Patient reports backache.  Contractions: Not present. Vag. Bleeding: None.  Movement: Present. Denies leaking of fluid.   The following portions of the patient's history were reviewed and updated as appropriate: allergies, current medications, past family history, past medical history, past social history, past surgical history and problem list. Problem list updated.  Objective:   Vitals:   03/22/21 0816  BP: 120/73  Pulse: 72  Weight: 188 lb (85.3 kg)    Fetal Status:     Movement: Present     General:  Alert, oriented and cooperative. Patient is in no acute distress.  Skin: Skin is warm and dry. No rash noted.   Cardiovascular: Normal heart rate noted  Respiratory: Normal respiratory effort, no problems with respiration noted  Abdomen: Soft, gravid, appropriate for gestational age. Pain/Pressure: Absent     Pelvic:  Cervical exam deferred        Extremities: Normal range of motion.  Edema: None  Mental Status: Normal mood and affect. Normal behavior. Normal judgment and thought content.   Urinalysis:      Assessment and Plan:  Pregnancy: G2P0010 at [redacted]w[redacted]d  1. Encounter for supervision of other normal pregnancy in first trimester   Preterm labor symptoms and general obstetric precautions including but not limited to vaginal bleeding, contractions, leaking of fluid and fetal movement were reviewed in detail with the patient. Please refer to After Visit Summary for other counseling recommendations.   Return in about 4 weeks (around 04/19/2021) for ROB.   Brock Bad, MD  03/22/21

## 2021-04-02 ENCOUNTER — Ambulatory Visit: Payer: BC Managed Care – PPO

## 2021-04-03 ENCOUNTER — Ambulatory Visit (HOSPITAL_BASED_OUTPATIENT_CLINIC_OR_DEPARTMENT_OTHER): Payer: BC Managed Care – PPO | Admitting: Maternal & Fetal Medicine

## 2021-04-03 ENCOUNTER — Other Ambulatory Visit: Payer: Self-pay | Admitting: *Deleted

## 2021-04-03 ENCOUNTER — Other Ambulatory Visit: Payer: Self-pay | Admitting: Nurse Practitioner

## 2021-04-03 ENCOUNTER — Other Ambulatory Visit: Payer: Self-pay

## 2021-04-03 ENCOUNTER — Ambulatory Visit: Payer: BC Managed Care – PPO | Attending: Nurse Practitioner

## 2021-04-03 DIAGNOSIS — O35EXX0 Maternal care for other (suspected) fetal abnormality and damage, fetal genitourinary anomalies, not applicable or unspecified: Secondary | ICD-10-CM

## 2021-04-03 DIAGNOSIS — Z3481 Encounter for supervision of other normal pregnancy, first trimester: Secondary | ICD-10-CM

## 2021-04-03 DIAGNOSIS — R638 Other symptoms and signs concerning food and fluid intake: Secondary | ICD-10-CM

## 2021-04-03 DIAGNOSIS — Q632 Ectopic kidney: Secondary | ICD-10-CM | POA: Diagnosis not present

## 2021-04-03 DIAGNOSIS — O358XX0 Maternal care for other (suspected) fetal abnormality and damage, not applicable or unspecified: Secondary | ICD-10-CM

## 2021-04-03 DIAGNOSIS — Z362 Encounter for other antenatal screening follow-up: Secondary | ICD-10-CM

## 2021-04-03 NOTE — Progress Notes (Signed)
MFM Brief Consult  Ms. Rizzo is a 25 yo G2P0 who is here with a single intrauterine pregnancy here for a detailed anatomy due to new finding of a right pelvic kidney and elevated BMI.  She has a low risk NIPS.  Normal anatomy with measurements consistent with dates There is good fetal movement and amniotic fluid volume Suboptimal views of the fetal anatomy were obtained secondary to fetal position.  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 A pelvic kidney is located opposite the sacrum and below the aortic bifurcation. Pelvic kidney occurs in 1?:?2000 to 1?:?3000 births. It is slightly more common in males, and a left-sided predominance has been reported. Pelvic kidney is bilateral in 12% of cases. During the 6th through 9th gestational weeks, the kidneys ascend from the pelvis to the lumbar region just below the adrenal glands. Failure of ascent results in renal ectopia. The mechanism behind failed ascent is unknown. Theories include ureteral bud maldevelopment, defective metanephric tissue, genetic abnormalities, vascular obstruction, and teratogen exposure.Pelvic kidney results from complete failure of ascent. Pelvic kidneys are usually located below the level of the aortic bifurcation and opposite the sacrum.  Overall outcomes are good. We recommended postnatal evaluation of via renal ultrasound.  Follow up growth is scheduled in 4 weeks to complete the fetal anatomy.  I spent 30 minutes with >50% in face to face consultation.  Novella Olive, MD  All questions answered.

## 2021-04-04 ENCOUNTER — Encounter: Payer: Self-pay | Admitting: Nurse Practitioner

## 2021-04-04 DIAGNOSIS — O359XX0 Maternal care for (suspected) fetal abnormality and damage, unspecified, not applicable or unspecified: Secondary | ICD-10-CM | POA: Insufficient documentation

## 2021-04-17 ENCOUNTER — Other Ambulatory Visit: Payer: Self-pay

## 2021-04-17 ENCOUNTER — Ambulatory Visit (INDEPENDENT_AMBULATORY_CARE_PROVIDER_SITE_OTHER): Payer: BC Managed Care – PPO | Admitting: Women's Health

## 2021-04-17 VITALS — BP 118/67 | HR 81 | Wt 195.0 lb

## 2021-04-17 DIAGNOSIS — Z3481 Encounter for supervision of other normal pregnancy, first trimester: Secondary | ICD-10-CM

## 2021-04-17 DIAGNOSIS — Z3A21 21 weeks gestation of pregnancy: Secondary | ICD-10-CM

## 2021-04-17 DIAGNOSIS — O359XX0 Maternal care for (suspected) fetal abnormality and damage, unspecified, not applicable or unspecified: Secondary | ICD-10-CM

## 2021-04-17 NOTE — Progress Notes (Signed)
+   Fetal movement. No complaints.  

## 2021-04-17 NOTE — Patient Instructions (Signed)
Maternity Assessment Unit (MAU)  The Maternity Assessment Unit (MAU) is located at the [facility] at [facility]. The address is: [address], [address], [place], [place] [address]. Please see map below for additional directions.    The Maternity Assessment Unit is designed to help you during your pregnancy, and for up to 6 weeks after delivery, with any pregnancy- or postpartum-related emergencies, if you think you are in labor, or if your water has broken. For example, if you experience nausea and vomiting, vaginal bleeding, severe abdominal or pelvic pain, elevated blood pressure or other problems related to your pregnancy or postpartum time, please come to the Maternity Assessment Unit for assistance.       Preterm Labor The normal length of a pregnancy is 39-41 weeks. Preterm labor is when labor starts before 37 completed weeks of pregnancy. Babies who are born prematurely and survive may not be fully developed and may be at an increased risk for long-term problems such as cerebral palsy, developmental delays, and vision and hearing problems. Babies who are born too early may have problems soon after birth. Premature babies may have problems regulating blood sugar, body temperature, heart rate, and breathing rate. These babies often have trouble with feeding. The risk of having problems is highest for babies who are born before 34 weeks of pregnancy. What are the causes? The exact cause of this condition is not known. What increases the risk? You are more likely to have preterm labor if you have certain risk factors that relate to your medical history, problems with present and past pregnancies, and lifestyle factors. Medical history You have abnormalities of the uterus, including a short cervix. You have STIs (sexually transmitted infections) or other infections of the urinary tract and the vagina. You have chronic illnesses, such as blood clotting  problems, diabetes, or high blood pressure. You are overweight or underweight. Present and past pregnancies You have had preterm labor before. You are pregnant with twins or other multiples. You have been diagnosed with a condition in which the placenta covers your cervix (placenta previa). You waited less than 18 months between giving birth and becoming pregnant again. Your unborn baby has some abnormalities. You have vaginal bleeding during pregnancy. You became pregnant through in vitro fertilization (IVF). Lifestyle and environmental factors You use tobacco products or drink alcohol. You use drugs. You have stress and no social support. You experience domestic violence. You are exposed to certain chemicals or environmental pollutants. Other factors You are younger than age 17 or older than age 35. What are the signs or symptoms? Symptoms of this condition include: Cramps similar to those that can happen during a menstrual period. The cramps may happen with diarrhea. Pain in the abdomen or lower back. Regular contractions that may feel like tightening of the abdomen. A feeling of increased pressure in the pelvis. Increased watery or bloody mucus discharge from the vagina. Water breaking (ruptured amniotic sac). How is this diagnosed? This condition is diagnosed based on: Your medical history and a physical exam. A pelvic exam. An ultrasound. Monitoring your uterus for contractions. Other tests, including: A swab of the cervix to check for a chemical called fetal fibronectin. Urine tests. How is this treated? Treatment for this condition depends on the length of your pregnancy, your condition, and the health of your baby. Treatment may include: Taking medicines, such as: Hormone medicines. These may be given early in pregnancy to help support the pregnancy. Medicines to stop   contractions. Medicines to help mature the baby's lungs. These may be prescribed if the risk of  delivery is high. Medicines to help protect your baby from brain and nerve complications such as cerebral palsy. Bed rest. If the labor happens before 34 weeks of pregnancy, you may need to stay in the hospital. Delivery of the baby. Follow these instructions at home:  Do not use any products that contain nicotine or tobacco. These products include cigarettes, chewing tobacco, and vaping devices, such as e-cigarettes. If you need help quitting, ask your health care provider. Do not drink alcohol. Take over-the-counter and prescription medicines only as told by your health care provider. Rest as told by your health care provider. Return to your normal activities as told by your health care provider. Ask your health care provider what activities are safe for you. Keep all follow-up visits. This is important. How is this prevented? To increase your chance of having a full-term pregnancy: Do not use drugs or take medicines that have not been prescribed to you during your pregnancy. Talk with your health care provider before taking any herbal supplements, even if you have been taking them regularly. Make sure you gain a healthy amount of weight during your pregnancy. Watch for infection. If you think that you might have an infection, get it checked right away. Symptoms of infection may include: Fever. Abnormal vaginal discharge or discharge that smells bad. Pain or burning with urination. Needing to urinate urgently. Frequently urinating or passing small amounts of urine frequently. Blood in your urine or urine that smells bad or unusual. Where to find more information U.S. Department of Health and Cytogeneticist on Women's Health: http://hoffman.com/ [employer] of Obstetricians and Gynecologists: www.acog.org Centers for Disease Control and Prevention, Preterm Birth: FootballExhibition.com.br Contact a health care provider if: You think you are going into preterm labor. You have signs  or symptoms of preterm labor. You have symptoms of infection. Get help right away if: You are having regular, painful contractions every 5 minutes or less. Your water breaks. Summary Preterm labor is labor that starts before you reach 37 weeks of pregnancy. Delivering your baby early increases your baby's risk of developing long-term problems. You are more likely to have preterm labor if you have certain risk factors that relate to your medical history, problems with present and past pregnancies, and lifestyle factors. Keep all follow-up visits. This is important. Contact a health care provider if you have signs or symptoms of preterm labor. This information is not intended to replace advice given to you by your health care provider. Make sure you discuss any questions you have with your health care provider. Document Revised: 08/07/2020 Document Reviewed: 08/07/2020 Elsevier Patient Education  2022 Elsevier Inc.       Alpha-Fetoprotein Test Why am I having this test? The alpha-fetoprotein test is a lab test most commonly used for pregnant women to help screen for birth defects in their unborn baby. It can be used to screen for chromosome (DNA) abnormalities, problems with the brain or spinal cord, or problems with the abdominal wall of the unborn baby (fetus). The alpha-fetoprotein test may also be done for men or nonpregnant women to check for certain cancers. What is being tested? This test measures the amount of alpha-fetoprotein (AFP) in your blood. AFP is a protein that is made by the liver. Levels can be detected in the mother's blood during pregnancy, starting at 10 weeks and peaking at 16-18 weeks of the pregnancy.  Abnormal levels can sometimes be a sign of a birth defect in the baby. Certain cancers can cause a high level of AFP in men and nonpregnant women. What kind of sample is taken? A blood sample is required for this test. It is usually collected by inserting a needle  into a blood vessel. How are the results reported? Your test results will be reported as values. Your health care provider will compare your results to normal ranges that were established after testing a large group of people (reference values). Reference values may vary among labs and hospitals. For this test, common reference values are: Adult: Less than 40 ng/mL or less than 40 mcg/L (SI units). Child younger than 1 year: Less than 30 ng/mL. If you are pregnant, the values may also vary based on how long you have been pregnant. What do the results mean? Results that are above the reference values in pregnant women may indicate the following for the baby: Neural tube defects, such as abnormalities of the spinal cord or brain. Abdominal wall defects. Multiple pregnancy such as twins. Fetal distress or fetal death. Results that are above the reference values in men or nonpregnant women may indicate: Reproductive cancers, such as ovarian or testicular cancer. Liver cancer. Liver cell death. Other types of cancer. Very low levels of AFP in pregnant women may indicate Down syndrome for the baby. Talk with your health care provider about what your results mean. Questions to ask your health care provider Ask your health care provider, or the department that is doing the test: When will my results be ready? How will I get my results? What are my treatment options? What other tests do I need? What are my next steps? Summary The alpha-fetoprotein test is done on pregnant women to help screen for birth defects in their unborn baby. Certain cancers can cause a high level of AFP in men and nonpregnant women. For this test, a blood sample is usually collected by inserting a needle into a blood vessel. Talk with your health care provider about what your results mean. This information is not intended to replace advice given to you by your health care provider. Make sure you discuss any questions you  have with your health care provider. Document Revised: 02/24/2020 Document Reviewed: 02/24/2020 Elsevier Patient Education  2022 [employer]       Contraception Choices Contraception, also called birth control, refers to methods or devices that prevent pregnancy. Hormonal methods Contraceptive implant A contraceptive implant is a thin, plastic tube that contains a hormone that prevents pregnancy. It is different from an intrauterine device (IUD). It is inserted into the upper part of the arm by a health care provider. Implants can be effective for up to 3 years. Progestin-only injections Progestin-only injections are injections of progestin, a synthetic form of the hormone progesterone. They are given every 3 months by a health care provider. Birth control pills Birth control pills are pills that contain hormones that prevent pregnancy. They must be taken once a day, preferably at the same time each day. A prescription is needed to use this method of contraception. Birth control patch The birth control patch contains hormones that prevent pregnancy. It is placed on the skin and must be changed once a week for three weeks and removed on the fourth week. A prescription is needed to use this method of contraception. Vaginal ring A vaginal ring contains hormones that prevent pregnancy. It is placed in the vagina for three weeks and  removed on the fourth week. After that, the process is repeated with a new ring. A prescription is needed to use this method of contraception. Emergency contraceptive Emergency contraceptives prevent pregnancy after unprotected sex. They come in pill form and can be taken up to 5 days after sex. They work best the sooner they are taken after having sex. Most emergency contraceptives are available without a prescription. This method should not be used as your only form of birth control. Barrier methods Female condom A female condom is a thin sheath that is worn over  the penis during sex. Condoms keep sperm from going inside a woman's body. They can be used with a sperm-killing substance (spermicide) to increase their effectiveness. They should be thrown away after one use. Female condom A female condom is a soft, loose-fitting sheath that is put into the vagina before sex. The condom keeps sperm from going inside a woman's body. They should be thrown away after one use. Diaphragm A diaphragm is a soft, dome-shaped barrier. It is inserted into the vagina before sex, along with a spermicide. The diaphragm blocks sperm from entering the uterus, and the spermicide kills sperm. A diaphragm should be left in the vagina for 6-8 hours after sex and removed within 24 hours. A diaphragm is prescribed and fitted by a health care provider. A diaphragm should be replaced every 1-2 years, after giving birth, after gaining more than 15 lb (6.8 kg), and after pelvic surgery. Cervical cap A cervical cap is a round, soft latex or plastic cup that fits over the cervix. It is inserted into the vagina before sex, along with spermicide. It blocks sperm from entering the uterus. The cap should be left in place for 6-8 hours after sex and removed within 48 hours. A cervical cap must be prescribed and fitted by a health care provider. It should be replaced every 2 years. Sponge A sponge is a soft, circular piece of polyurethane foam with spermicide in it. The sponge helps block sperm from entering the uterus, and the spermicide kills sperm. To use it, you make it wet and then insert it into the vagina. It should be inserted before sex, left in for at least 6 hours after sex, and removed and thrown away within 30 hours. Spermicides Spermicides are chemicals that kill or block sperm from entering the cervix and uterus. They can come as a cream, jelly, suppository, foam, or tablet. A spermicide should be inserted into the vagina with an applicator at least 10-15 minutes before sex to allow time  for it to work. The process must be repeated every time you have sex. Spermicides do not require a prescription. Intrauterine contraception Intrauterine device (IUD) An IUD is a T-shaped device that is put in a woman's uterus. There are two types: Hormone IUD.This type contains progestin, a synthetic form of the hormone progesterone. This type can stay in place for 3-5 years. Copper IUD.This type is wrapped in copper wire. It can stay in place for 10 years. Permanent methods of contraception Female tubal ligation In this method, a woman's fallopian tubes are sealed, tied, or blocked during surgery to prevent eggs from traveling to the uterus. Hysteroscopic sterilization In this method, a small, flexible insert is placed into each fallopian tube. The inserts cause scar tissue to form in the fallopian tubes and block them, so sperm cannot reach an egg. The procedure takes about 3 months to be effective. Another form of birth control must be used during those  3 months. Female sterilization This is a procedure to tie off the tubes that carry sperm (vasectomy). After the procedure, the man can still ejaculate fluid (semen). Another form of birth control must be used for 3 months after the procedure. Natural planning methods Natural family planning In this method, a couple does not have sex on days when the woman could become pregnant. Calendar method In this method, the woman keeps track of the length of each menstrual cycle, identifies the days when pregnancy can happen, and does not have sex on those days. Ovulation method In this method, a couple avoids sex during ovulation. Symptothermal method This method involves not having sex during ovulation. The woman typically checks for ovulation by watching changes in her temperature and in the consistency of cervical mucus. Post-ovulation method In this method, a couple waits to have sex until after ovulation. Where to find more information Centers  for Disease Control and Prevention: FootballExhibition.com.br Summary Contraception, also called birth control, refers to methods or devices that prevent pregnancy. Hormonal methods of contraception include implants, injections, pills, patches, vaginal rings, and emergency contraceptives. Barrier methods of contraception can include female condoms, female condoms, diaphragms, cervical caps, sponges, and spermicides. There are two types of IUDs (intrauterine devices). An IUD can be put in a woman's uterus to prevent pregnancy for 3-5 years. Permanent sterilization can be done through a procedure for males and females. Natural family planning methods involve nothaving sex on days when the woman could become pregnant. This information is not intended to replace advice given to you by your health care provider. Make sure you discuss any questions you have with your health care provider. Document Revised: 01/09/2020 Document Reviewed: 01/09/2020 Elsevier Patient Education  2022 Elsevier Inc.       AREA PEDIATRIC/FAMILY PRACTICE PHYSICIANS  [facility] [address] [place], [place] [address] Phone - [phone]   Fax - [phone]  JACK AMOS [address] [place], [place]  [address] Phone - [phone]   Fax - [phone]  [facility] [address] [place], [place]  [address] Phone - [phone]   Fax - [phone]  [facility] [address] [place], [place]  [address] Phone - [phone]   Fax - [phone]  [facility] [address] [place], [place]  [address] Phone - [phone]   Fax - [phone]  [facility] [address] [place], [place]  [address] Phone - [phone]   Fax - [phone]  [facility]  [address] [place], [place]  [address] Phone - [phone]   Fax - [phone]  [facility] AT  [facility] [address] [place], [address] [place], [place]  [address] Phone - [phone]   Fax - [phone]  [facility] AT [facility] [address] [place], [place]  [address] Phone - [phone]   Fax - [phone] [facility] AT [place] [address] [place], [place]  [address] Phone - [phone]   Fax - [phone]  [facility] [address] [place], [place]  [address] Phone - [phone]   Fax - [phone]  [facility] [address], [address] [place], [place]  [address] Phone - [phone]   Fax - [phone]  [facility] [place] [address] [place], [place]  [address] Phone - [phone]   Fax - [phone]  [facility] GOSRANI [address], [address] [place], [place]  [address] Phone - [phone]  [facility] [address] [place], [place]  [address] Phone - [phone]   Fax - [phone]  [facility] [address] [place], [place]  [address] Phone - [phone]   Fax - [phone]  [facility] [address] [place], [place]  [address] Phone - [phone]   Fax - [phone]  [facility] [address] [place], [place]  [address] Phone - [phone]   Fax - [phone]   [facility] [address] [place], [place]  [address] Phone - [phone]   Fax - [phone]  [facility] [address] [place], [place]  [address] Phone - [phone]   Fax - [phone]  DAVID RUBIN [address] [place], [place]  [address] Phone - [phone]   Fax - [phone]  [facility] [address] [place], [place]  [address] Phone - [phone]   Fax - [phone]  [facility] Alita Chyle [address] [place], [place]  [address] Phone - [phone]   Fax -  [phone] Gerarda Fraction [address] [place], [place]  [address] Phone - [phone]   Fax - [phone]  [facility] [address] [place], [place]  [address] Phone - [phone]   Fax - [phone]  [facility] - [place] [address] [place], [place]  [address] Phone - [phone]   Fax - [phone]        Childbirth Education Options: [facility] Department Classes:  Childbirth education classes can help you get ready for a positive parenting experience. You can also meet other expectant parents and get free stuff for your baby. Each class runs for five weeks on the same night and costs $45 for the mother-to-be and her support person. Medicaid covers the cost if you are eligible. Call [phone] to register. [facility] Childbirth Education: Classes can vary in availability and schedule is subject to change. For most up-to-date information please visit [email or web address] to review and register.

## 2021-04-17 NOTE — Progress Notes (Signed)
Subjective:  Stephanie [name] is a 25 y.o. G2P0010 at [redacted]w[redacted]d being seen today for ongoing prenatal care.  She is currently monitored for the following issues for this low-risk pregnancy and has Learning problem; Miscarriage within last 12 months; Encounter for supervision of normal pregnancy in first trimester; BMI 32.0-32.9,adult; and Fetal abnormality in antepartum pregnancy on their problem list.  Patient reports no complaints.  Contractions: Not present. Vag. Bleeding: None.  Movement: Present. Denies leaking of fluid.   The following portions of the patient's history were reviewed and updated as appropriate: allergies, current medications, past family history, past medical history, past social history, past surgical history and problem list. Problem list updated.  Objective:   Vitals:   04/17/21 1446  BP: 118/67  Pulse: 81  Weight: 195 lb (88.5 kg)    Fetal Status: Fetal Heart Rate (bpm): 144   Movement: Present     General:  Alert, oriented and cooperative. Patient is in no acute distress.  Skin: Skin is warm and dry. No rash noted.   Cardiovascular: Normal heart rate noted  Respiratory: Normal respiratory effort, no problems with respiration noted  Abdomen: Soft, gravid, appropriate for gestational age. Pain/Pressure: Absent     Pelvic: Vag. Bleeding: None     Cervical exam deferred        Extremities: Normal range of motion.  Edema: None  Mental Status: Normal mood and affect. Normal behavior. Normal judgment and thought content.   Urinalysis:      Assessment and Plan:  Pregnancy: G2P0010 at 105w0d  1. Encounter for supervision of other normal pregnancy in first trimester - AFP, Serum, Open Spina Bifida -discussed contraception, info given, pt wishes to use condoms -peds list given -CBE info given  PHQ9 SCORE ONLY 01/17/2021  PHQ-9 Total Score 0   GAD 7 : Generalized Anxiety Score 01/17/2021  Nervous, Anxious, on Edge 0  Control/stop worrying 0  Worry too much -  different things 0  Trouble relaxing 0  Restless 0  Easily annoyed or irritable 0  Afraid - awful might happen 0  Total GAD 7 Score 0   2. Fetal abnormality in antepartum pregnancy, single or unspecified fetus -Fetal pelvic kidney, right, noted and reevaluation by [place] after birth is recommended  3. [redacted] weeks gestation of pregnancy  Preterm labor symptoms and general obstetric precautions including but not limited to vaginal bleeding, contractions, leaking of fluid and fetal movement were reviewed in detail with the patient. I discussed the assessment and treatment plan with the patient. The patient was provided an opportunity to ask questions and all were answered. The patient agreed with the plan and demonstrated an understanding of the instructions. The patient was advised to call back or seek an in-person office evaluation/go to [facility] at [facility] for any urgent or concerning symptoms. Please refer to After Visit Summary for other counseling recommendations.  Return in about 4 weeks (around 05/15/2021) for in-person LOB/APP OK.   Avraham Benish, Odie Sera, NP

## 2021-04-19 LAB — AFP, SERUM, OPEN SPINA BIFIDA
AFP MoM: 1.98
AFP Value: 113.8 ng/mL
Gest. Age on Collection Date: 21 weeks
Maternal Age At EDD: 26.9 yr
OSBR Risk 1 IN: 842
Test Results:: NEGATIVE
Weight: 195 [lb_av]

## 2021-05-08 ENCOUNTER — Ambulatory Visit: Payer: BC Managed Care – PPO

## 2021-05-08 DIAGNOSIS — H66001 Acute suppurative otitis media without spontaneous rupture of ear drum, right ear: Secondary | ICD-10-CM | POA: Diagnosis not present

## 2021-05-09 ENCOUNTER — Telehealth: Payer: Self-pay

## 2021-05-09 NOTE — Telephone Encounter (Signed)
[employer] order for breast pump, support stockings, and sacroiliac support signed and faxed.  Confirmation received.

## 2021-05-15 ENCOUNTER — Ambulatory Visit: Payer: BC Managed Care – PPO | Admitting: *Deleted

## 2021-05-15 ENCOUNTER — Ambulatory Visit: Payer: BC Managed Care – PPO | Attending: Maternal & Fetal Medicine

## 2021-05-15 ENCOUNTER — Other Ambulatory Visit: Payer: Self-pay

## 2021-05-15 ENCOUNTER — Encounter: Payer: Self-pay | Admitting: Obstetrics and Gynecology

## 2021-05-15 ENCOUNTER — Encounter: Payer: Self-pay | Admitting: *Deleted

## 2021-05-15 ENCOUNTER — Ambulatory Visit (INDEPENDENT_AMBULATORY_CARE_PROVIDER_SITE_OTHER): Payer: BC Managed Care – PPO | Admitting: Obstetrics and Gynecology

## 2021-05-15 VITALS — BP 119/69 | HR 83

## 2021-05-15 VITALS — BP 107/72 | HR 88 | Wt 200.0 lb

## 2021-05-15 DIAGNOSIS — O358XX0 Maternal care for other (suspected) fetal abnormality and damage, not applicable or unspecified: Secondary | ICD-10-CM | POA: Diagnosis not present

## 2021-05-15 DIAGNOSIS — E669 Obesity, unspecified: Secondary | ICD-10-CM | POA: Diagnosis not present

## 2021-05-15 DIAGNOSIS — O359XX0 Maternal care for (suspected) fetal abnormality and damage, unspecified, not applicable or unspecified: Secondary | ICD-10-CM

## 2021-05-15 DIAGNOSIS — Z3481 Encounter for supervision of other normal pregnancy, first trimester: Secondary | ICD-10-CM

## 2021-05-15 DIAGNOSIS — Z362 Encounter for other antenatal screening follow-up: Secondary | ICD-10-CM | POA: Insufficient documentation

## 2021-05-15 DIAGNOSIS — O99212 Obesity complicating pregnancy, second trimester: Secondary | ICD-10-CM

## 2021-05-15 DIAGNOSIS — R638 Other symptoms and signs concerning food and fluid intake: Secondary | ICD-10-CM | POA: Diagnosis not present

## 2021-05-15 DIAGNOSIS — O35EXX0 Maternal care for other (suspected) fetal abnormality and damage, fetal genitourinary anomalies, not applicable or unspecified: Secondary | ICD-10-CM

## 2021-05-15 DIAGNOSIS — Z23 Encounter for immunization: Secondary | ICD-10-CM

## 2021-05-15 DIAGNOSIS — Z3A25 25 weeks gestation of pregnancy: Secondary | ICD-10-CM

## 2021-05-15 NOTE — Progress Notes (Signed)
ROB, FLU vaccine given in LD, tolerated well. Reports no problems today.

## 2021-05-15 NOTE — Progress Notes (Signed)
Subjective:  Stephanie [name] is a 25 y.o. G2P0010 at [redacted]w[redacted]d being seen today for ongoing prenatal care.  She is currently monitored for the following issues for this high-risk pregnancy and has Learning problem; Miscarriage within last 12 months; Encounter for supervision of normal pregnancy in first trimester; BMI 32.0-32.9,adult; and Fetal abnormality in antepartum pregnancy on their problem list.  Patient reports no complaints.  Contractions: Not present. Vag. Bleeding: None.  Movement: Present. Denies leaking of fluid.   The following portions of the patient's history were reviewed and updated as appropriate: allergies, current medications, past family history, past medical history, past social history, past surgical history and problem list. Problem list updated.  Objective:   Vitals:   05/15/21 0853  BP: 107/72  Pulse: 88  Weight: 200 lb (90.7 kg)    Fetal Status: Fetal Heart Rate (bpm): 145   Movement: Present     General:  Alert, oriented and cooperative. Patient is in no acute distress.  Skin: Skin is warm and dry. No rash noted.   Cardiovascular: Normal heart rate noted  Respiratory: Normal respiratory effort, no problems with respiration noted  Abdomen: Soft, gravid, appropriate for gestational age. Pain/Pressure: Absent     Pelvic:  Cervical exam deferred        Extremities: Normal range of motion.  Edema: None  Mental Status: Normal mood and affect. Normal behavior. Normal judgment and thought content.   Urinalysis:      Assessment and Plan:  Pregnancy: G2P0010 at [redacted]w[redacted]d  1. Encounter for supervision of other normal pregnancy in first trimester Stable Glucola next visit - Flu Vaccine QUAD 36+ mos IM (Fluarix, Quad PF)  2. Fetal abnormality in antepartum pregnancy, single or unspecified fetus F/U U/S today  Preterm labor symptoms and general obstetric precautions including but not limited to vaginal bleeding, contractions, leaking of fluid and fetal movement were  reviewed in detail with the patient. Please refer to After Visit Summary for other counseling recommendations.  Return in about 4 weeks (around 06/12/2021) for OB visit, face to face, any provider, fasting for Glucola.   Hermina Staggers, MD

## 2021-05-15 NOTE — Patient Instructions (Signed)

## 2021-05-16 ENCOUNTER — Other Ambulatory Visit: Payer: Self-pay | Admitting: *Deleted

## 2021-05-16 DIAGNOSIS — R638 Other symptoms and signs concerning food and fluid intake: Secondary | ICD-10-CM

## 2021-05-16 DIAGNOSIS — Q632 Ectopic kidney: Secondary | ICD-10-CM

## 2021-05-16 DIAGNOSIS — Z3689 Encounter for other specified antenatal screening: Secondary | ICD-10-CM

## 2021-06-06 IMAGING — US US OB TRANSVAGINAL
1 series · 15 of 28 positions shown · non-contrast
Comparison: 08/06/2020

CLINICAL DATA: Status post miscarriage, vaginal bleeding

EXAM:
OBSTETRIC <14 WK US AND TRANSVAGINAL OB US
TECHNIQUE: Both transabdominal and transvaginal ultrasound examinations were
performed for complete evaluation of the gestation as well as the
maternal uterus, adnexal regions, and pelvic cul-de-sac.
Transvaginal technique was performed to assess early pregnancy.

[Series 1: us ob transvaginal · 68 acquisitions, 15 frames shown]
[im 1/68]
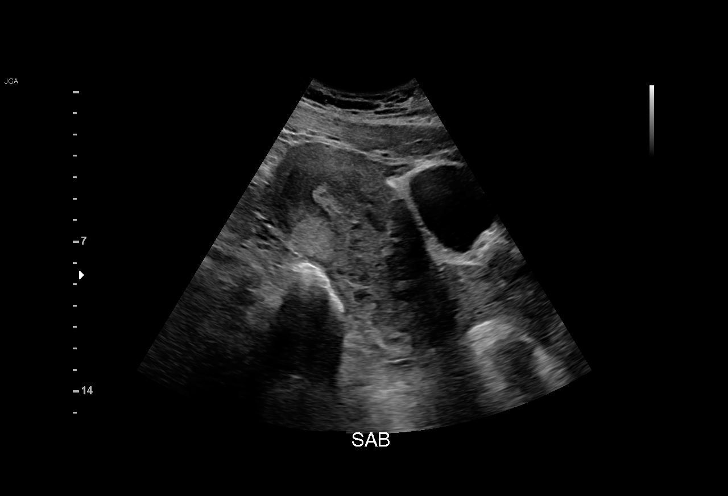
[im 5/68]
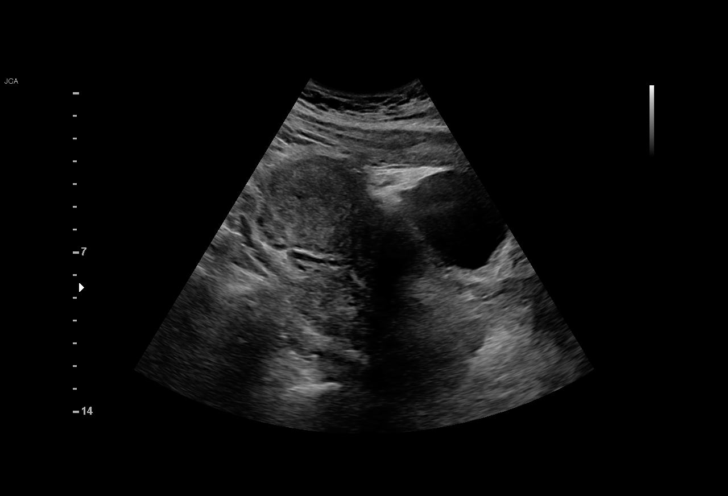
[im 10/68]
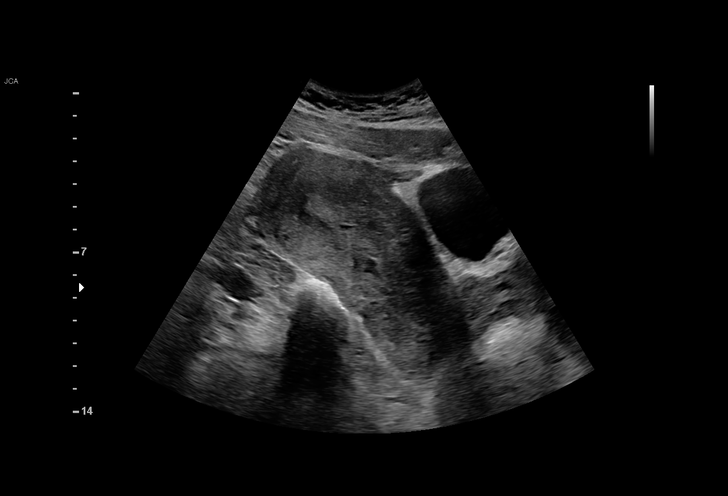
[im 15/68]
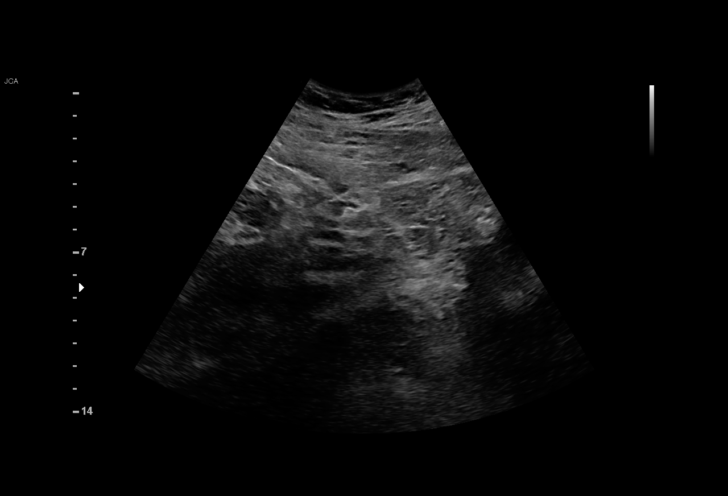
[im 20/68]
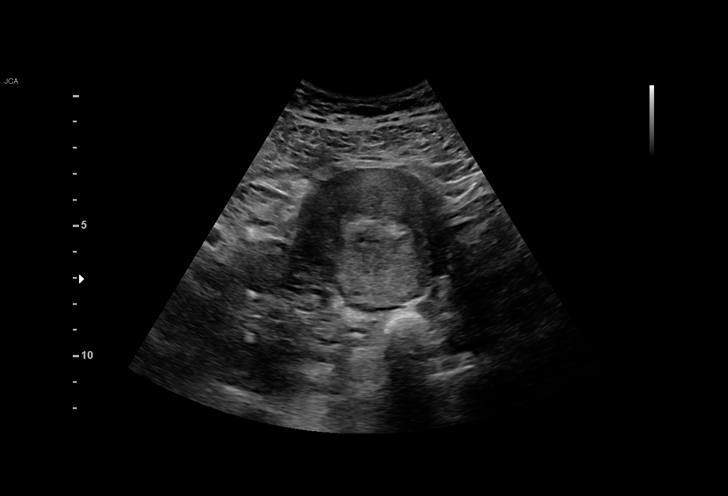
[im 25/68]
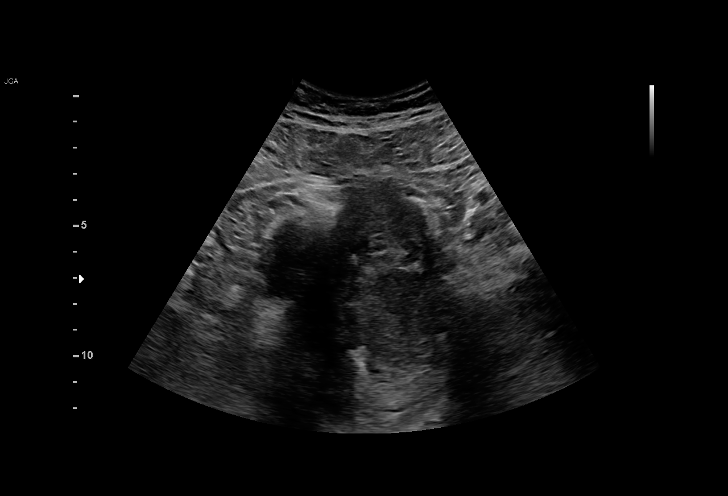
[im 30/68]
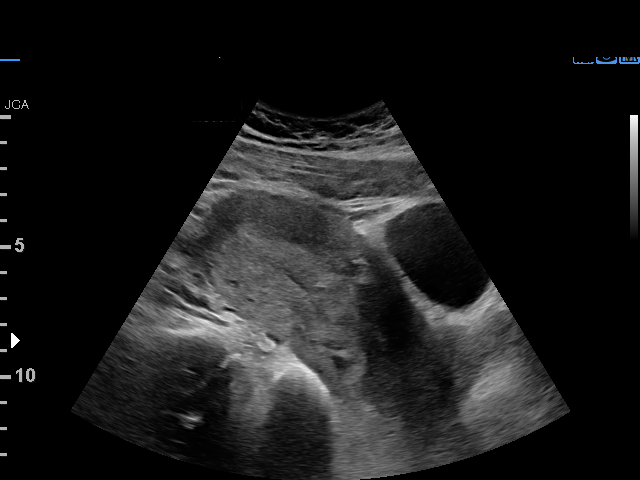
[im 35/68]
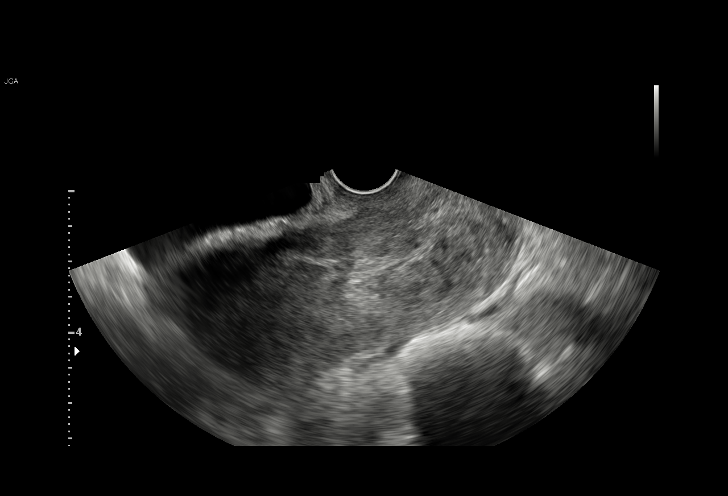
[im 38/68]
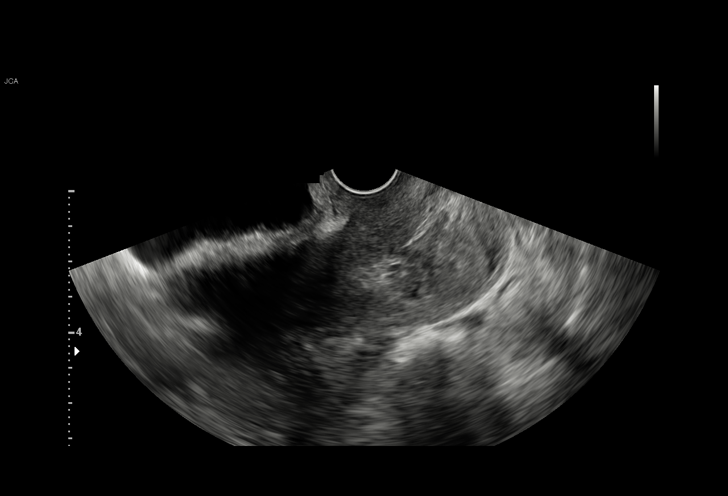
[im 43/68]
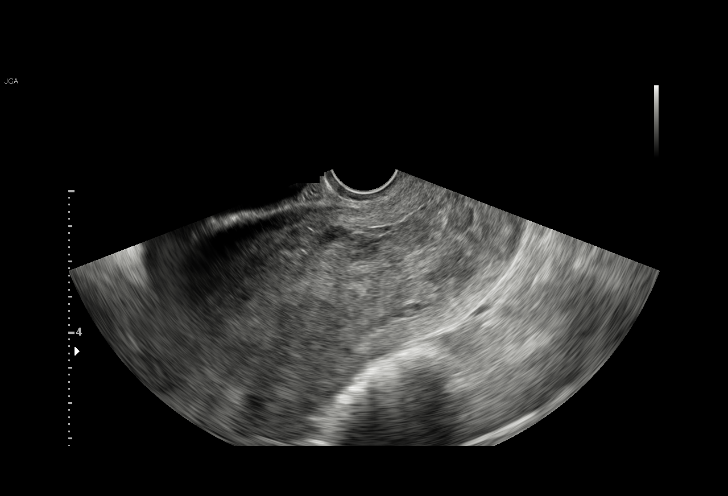
[im 48/68]
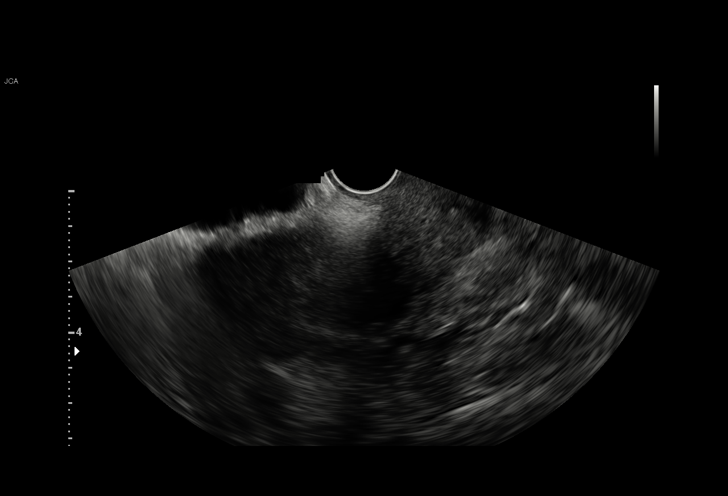
[im 53/68]
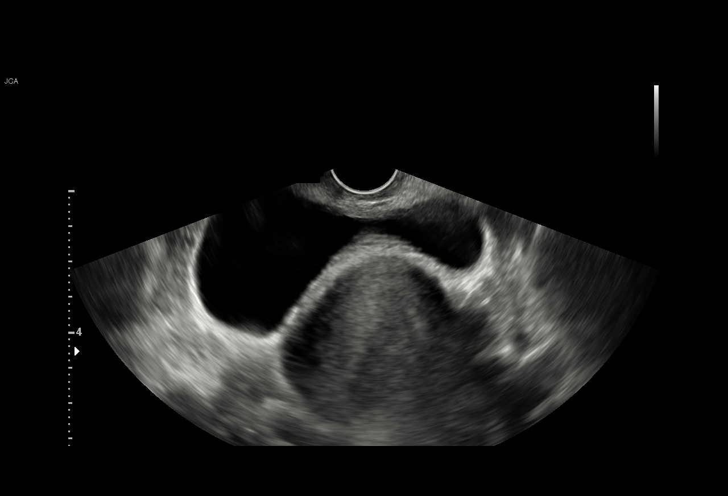
[im 58/68]
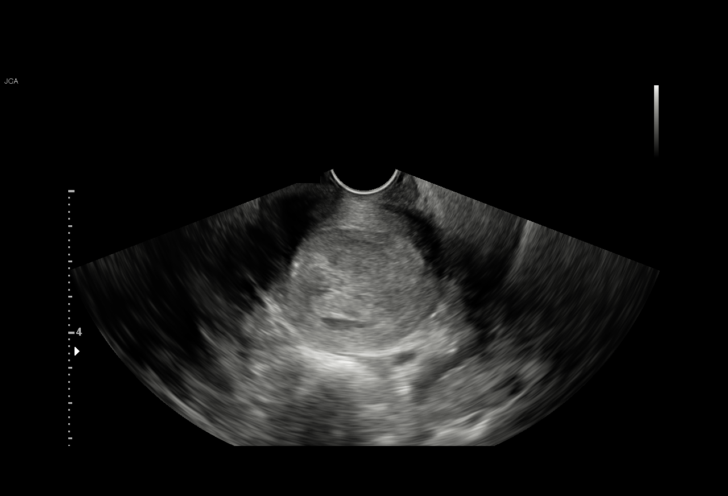
[im 63/68]
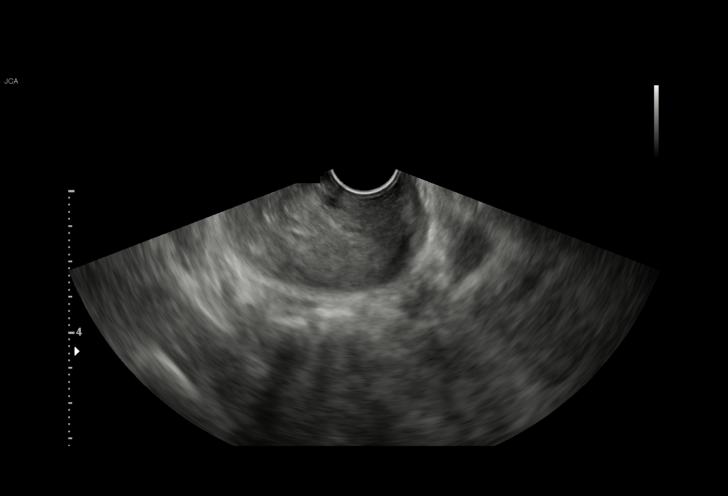
[im 68/68]
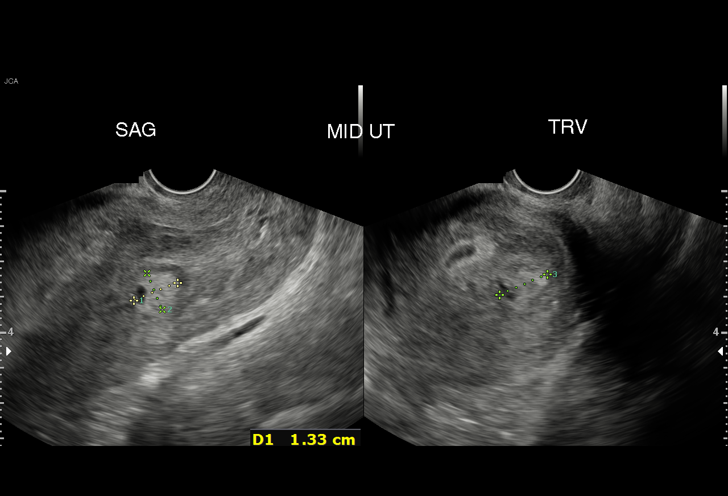

[15 of 28 positions shown; findings below may reference images not displayed]

FINDINGS: Intrauterine gestational sac: Not clearly identified

Subchorionic hemorrhage:  Not applicable

Maternal uterus/adnexae: Since the prior examination, there has
developed an extensive amount of heterogeneous debris within the
endometrial cavity most in keeping with a combination of hemorrhage
as well as retained products of conception this is best appreciated
on transverse images of the uterus. The uterine cavity is expanded,
measuring up to 36 mm in diameter. Several small complex partially
cystic collections are identified within the lower uterine segment,
best seen on image # 64 possibly representing the previously noted
gestational sac. The cervix is closed. There is no free fluid
within the cul-de-sac. The maternal ovaries are not visualized on
this examination.
IMPRESSION: Extensive debris within the endometrial cavity with loss of the
previously identified intrauterine gestational sac in keeping with
incomplete abortion with extensive blood product and probable
retained products of conception within the endometrial cavity.

## 2021-06-06 IMAGING — US US OB COMP LESS 14 WK
1 series · 15 of 28 positions shown · non-contrast
Comparison: 08/06/2020

CLINICAL DATA: Pelvic pain affecting pregnancy

EXAM:
OBSTETRIC <14 WK ULTRASOUND
TECHNIQUE: Transabdominal ultrasound was performed for evaluation of the
gestation as well as the maternal uterus and adnexal regions.

[Series 1: us ob comp less 14 wk · 37 acquisitions, 15 frames shown]
[im 1/37]
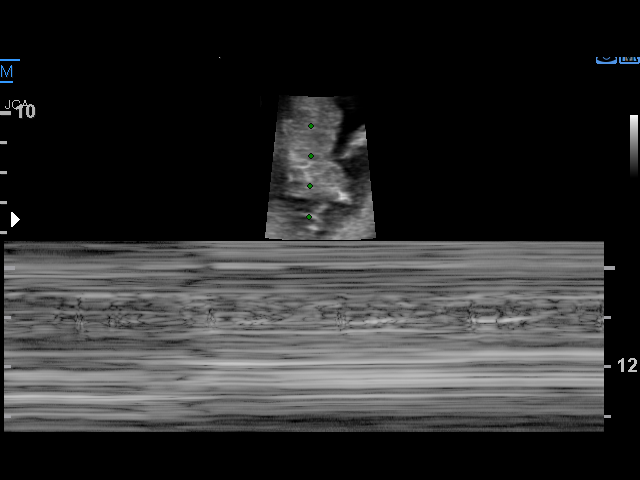
[im 3/37]
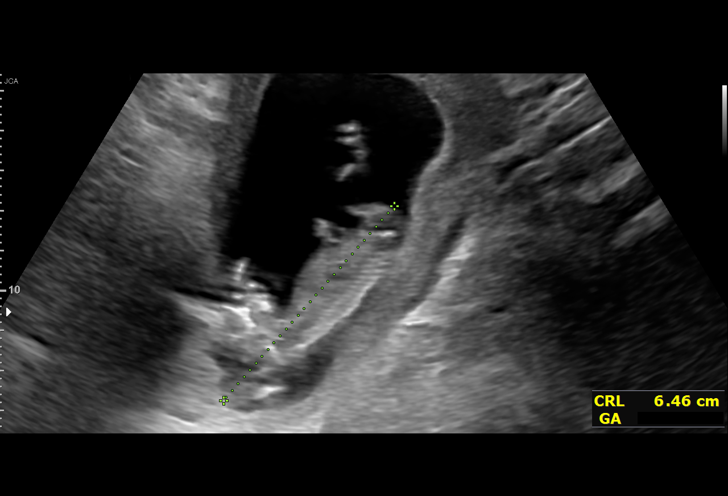
[im 6/37]
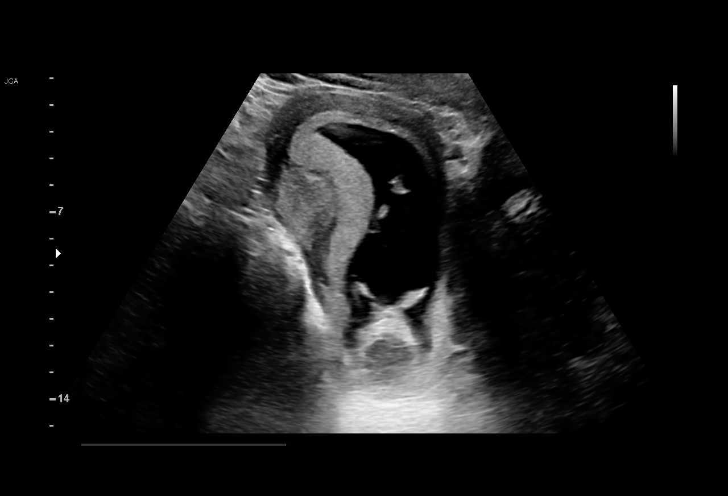
[im 9/37]
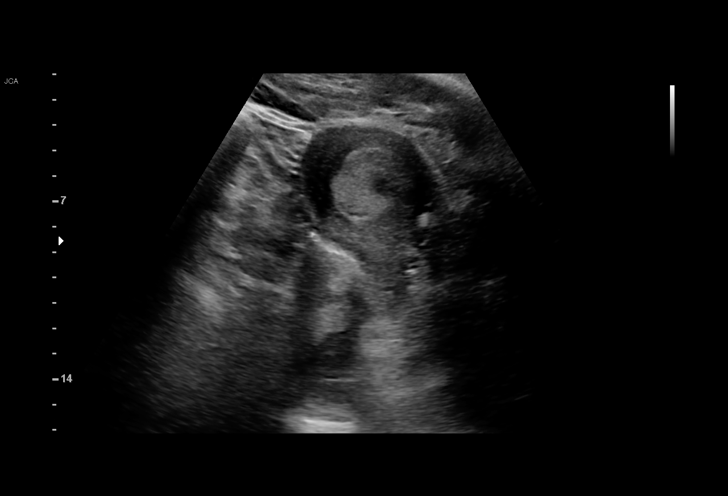
[im 11/37]
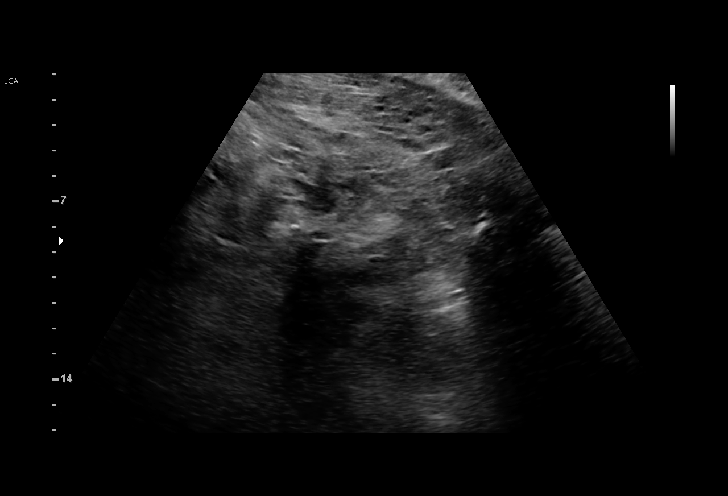
[im 14/37]
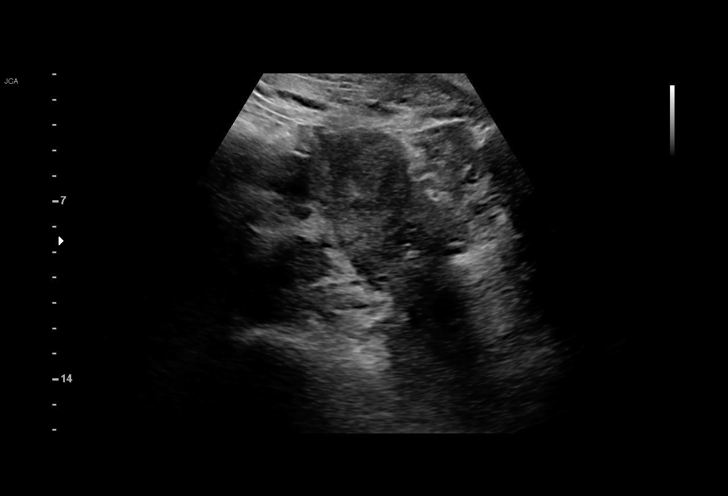
[im 17/37]
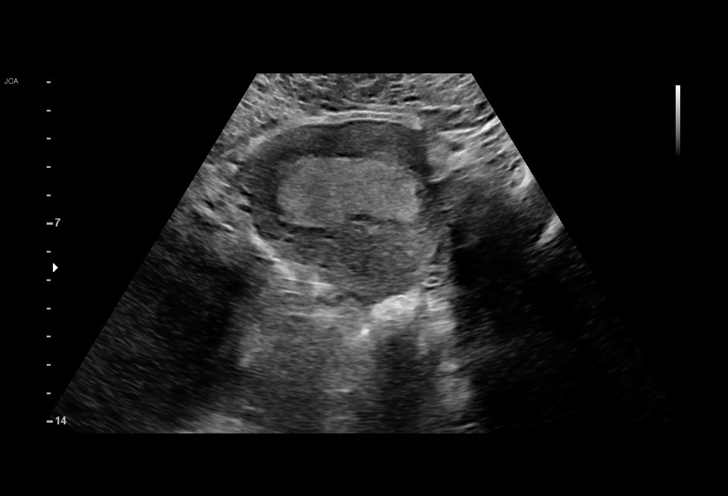
[im 19/37]
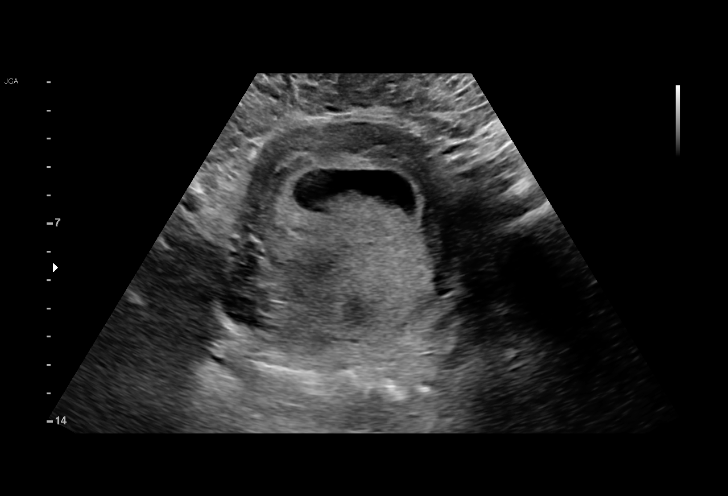
[im 21/37]
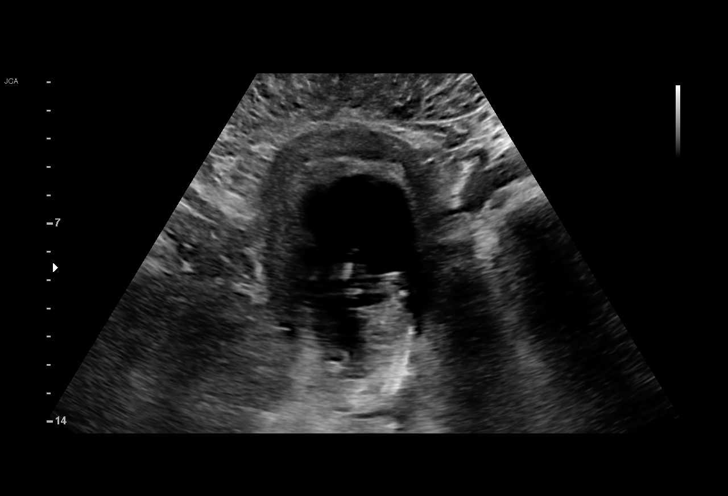
[im 23/37]
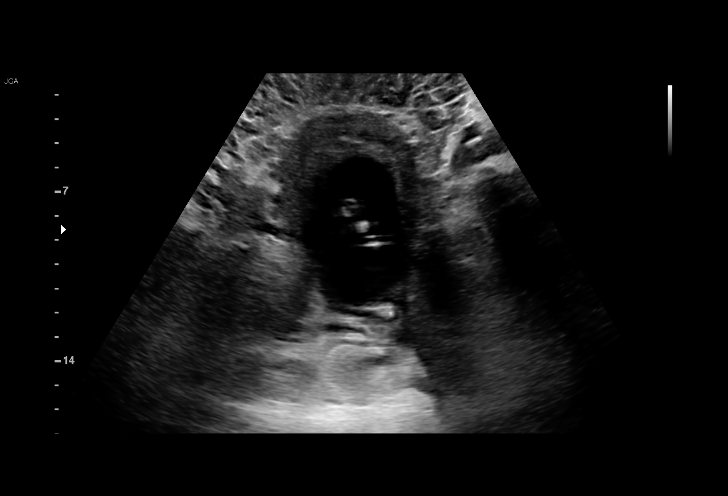
[im 26/37]
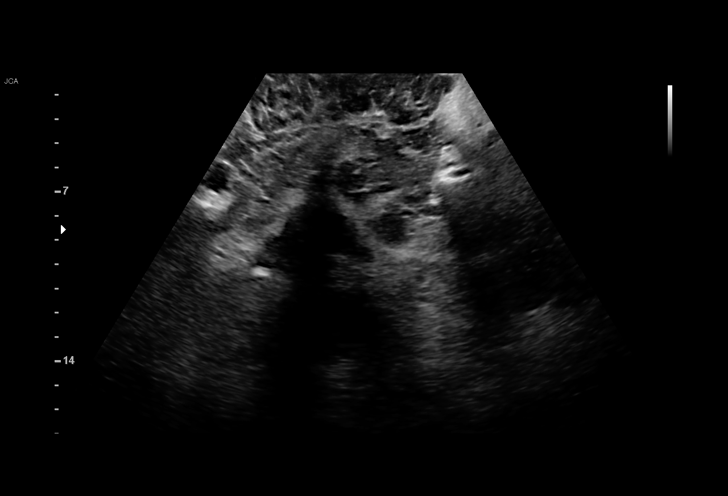
[im 29/37]
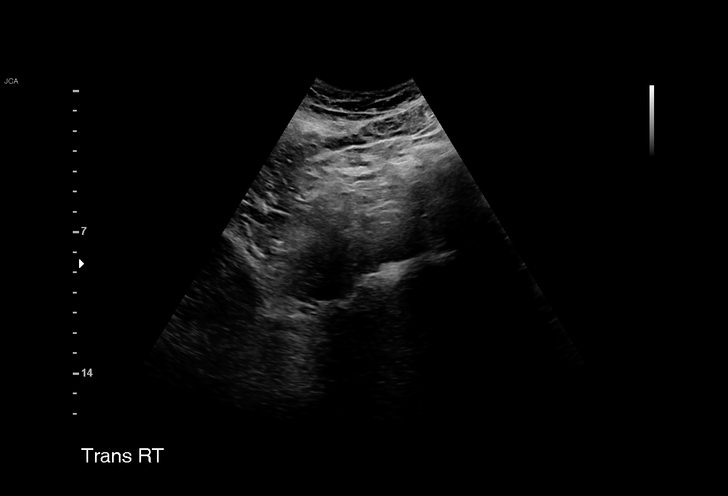
[im 31/37]
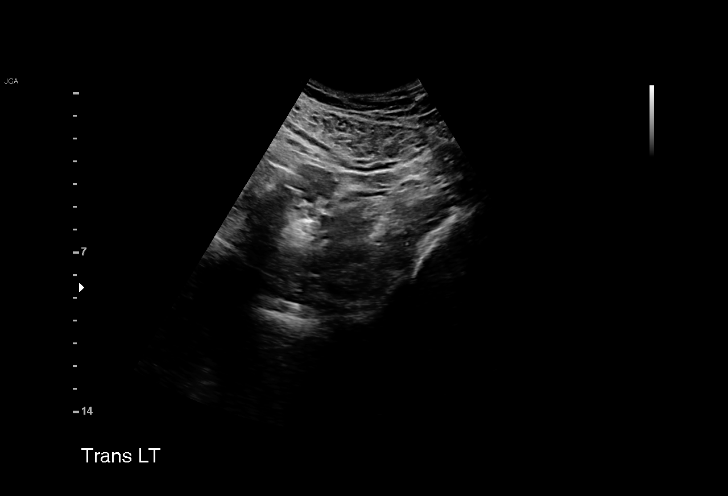
[im 34/37]
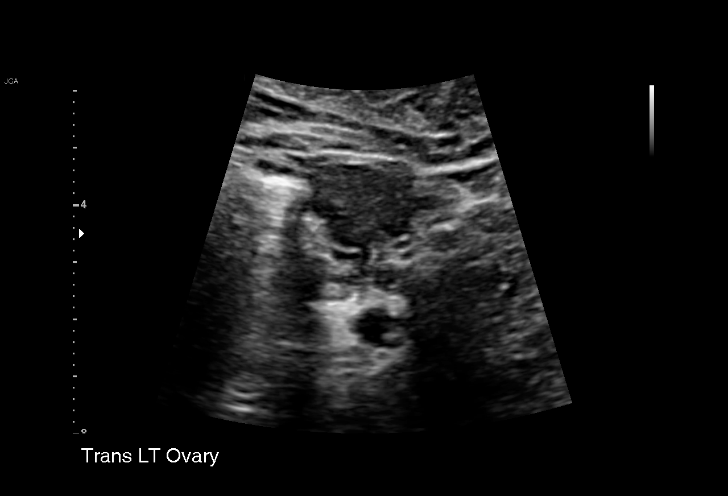
[im 37/37]
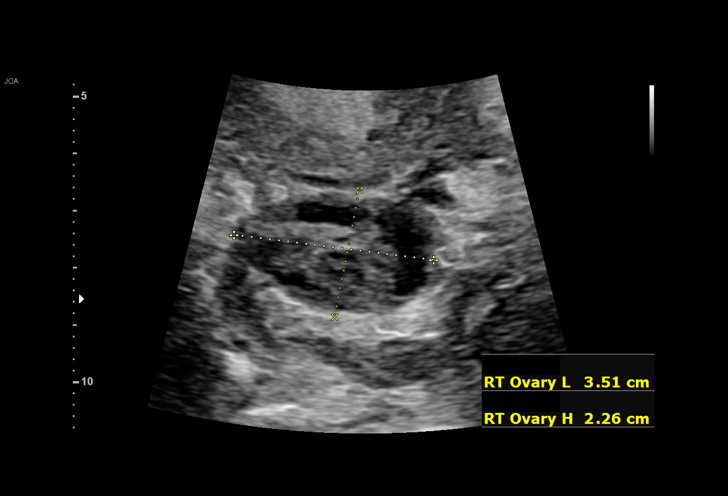

[15 of 28 positions shown; findings below may reference images not displayed]

FINDINGS: Intrauterine gestational sac: Single sac that appears somewhat
elongated, likely due to collapsed bladder. No visualized cervical
opening.

Yolk sac:  Visualized.

Embryo:  Visualized.

Cardiac Activity: Visualized.

Heart Rate: 162 bpm

CRL:   64.9 mm   12 w 6 d                  US EDC: 03/30/2021

Subchorionic hemorrhage:  None visualized.

Maternal uterus/adnexae: No detected abnormality.
IMPRESSION: Single living intrauterine pregnancy with expected growth from
comparison 08/06/2020.

## 2021-06-12 ENCOUNTER — Ambulatory Visit: Payer: BC Managed Care – PPO

## 2021-06-12 ENCOUNTER — Other Ambulatory Visit: Payer: Self-pay | Admitting: Maternal & Fetal Medicine

## 2021-06-12 ENCOUNTER — Encounter: Payer: Self-pay | Admitting: *Deleted

## 2021-06-12 ENCOUNTER — Ambulatory Visit (HOSPITAL_BASED_OUTPATIENT_CLINIC_OR_DEPARTMENT_OTHER): Payer: BC Managed Care – PPO

## 2021-06-12 ENCOUNTER — Other Ambulatory Visit: Payer: Self-pay

## 2021-06-12 ENCOUNTER — Ambulatory Visit: Payer: BC Managed Care – PPO | Attending: Maternal & Fetal Medicine | Admitting: *Deleted

## 2021-06-12 ENCOUNTER — Encounter: Payer: Self-pay | Admitting: Obstetrics & Gynecology

## 2021-06-12 ENCOUNTER — Other Ambulatory Visit: Payer: BC Managed Care – PPO

## 2021-06-12 ENCOUNTER — Ambulatory Visit (INDEPENDENT_AMBULATORY_CARE_PROVIDER_SITE_OTHER): Payer: BC Managed Care – PPO | Admitting: Obstetrics & Gynecology

## 2021-06-12 VITALS — BP 138/81 | HR 90 | Wt 211.0 lb

## 2021-06-12 VITALS — BP 118/69 | HR 83

## 2021-06-12 DIAGNOSIS — O365930 Maternal care for other known or suspected poor fetal growth, third trimester, not applicable or unspecified: Secondary | ICD-10-CM

## 2021-06-12 DIAGNOSIS — Z3A29 29 weeks gestation of pregnancy: Secondary | ICD-10-CM

## 2021-06-12 DIAGNOSIS — R638 Other symptoms and signs concerning food and fluid intake: Secondary | ICD-10-CM

## 2021-06-12 DIAGNOSIS — Q632 Ectopic kidney: Secondary | ICD-10-CM

## 2021-06-12 DIAGNOSIS — O99210 Obesity complicating pregnancy, unspecified trimester: Secondary | ICD-10-CM | POA: Insufficient documentation

## 2021-06-12 DIAGNOSIS — Z3689 Encounter for other specified antenatal screening: Secondary | ICD-10-CM | POA: Diagnosis not present

## 2021-06-12 DIAGNOSIS — O358XX0 Maternal care for other (suspected) fetal abnormality and damage, not applicable or unspecified: Secondary | ICD-10-CM

## 2021-06-12 DIAGNOSIS — O99213 Obesity complicating pregnancy, third trimester: Secondary | ICD-10-CM | POA: Insufficient documentation

## 2021-06-12 DIAGNOSIS — Z23 Encounter for immunization: Secondary | ICD-10-CM

## 2021-06-12 DIAGNOSIS — Z3403 Encounter for supervision of normal first pregnancy, third trimester: Secondary | ICD-10-CM

## 2021-06-12 DIAGNOSIS — E669 Obesity, unspecified: Secondary | ICD-10-CM

## 2021-06-12 DIAGNOSIS — R030 Elevated blood-pressure reading, without diagnosis of hypertension: Secondary | ICD-10-CM

## 2021-06-12 DIAGNOSIS — O359XX0 Maternal care for (suspected) fetal abnormality and damage, unspecified, not applicable or unspecified: Secondary | ICD-10-CM | POA: Insufficient documentation

## 2021-06-12 NOTE — Progress Notes (Signed)
   PRENATAL VISIT NOTE  Subjective:  Stephanie [name] is a 25 y.o. G2P0010 at [redacted]w[redacted]d being seen today for ongoing prenatal care.  She is currently monitored for the following issues for this low-risk pregnancy and has Learning problem; Miscarriage within last 12 months; Encounter for supervision of normal first pregnancy in third trimester; Fetal abnormality in antepartum pregnancy; and Obesity in pregnancy, antepartum on their problem list.  Patient reports no complaints.  Contractions: Not present. Vag. Bleeding: None.  Movement: Present. Denies leaking of fluid.   The following portions of the patient's history were reviewed and updated as appropriate: allergies, current medications, past family history, past medical history, past social history, past surgical history and problem list.   Objective:   Vitals:   06/12/21 0903  BP: 138/81  Pulse: 90  Weight: 211 lb (95.7 kg)    Fetal Status: Fetal Heart Rate (bpm): 146 Fundal Height: 28 cm Movement: Present     General:  Alert, oriented and cooperative. Patient is in no acute distress.  Skin: Skin is warm and dry. No rash noted.   Cardiovascular: Normal heart rate noted  Respiratory: Normal respiratory effort, no problems with respiration noted  Abdomen: Soft, gravid, appropriate for gestational age.  Pain/Pressure: Present     Pelvic: Cervical exam deferred        Extremities: Normal range of motion.  Edema: Mild pitting, slight indentation  Mental Status: Normal mood and affect. Normal behavior. Normal judgment and thought content.   Assessment and Plan:  Pregnancy: G2P0010 at [redacted]w[redacted]d 1. Borderline hypertension Patient denies any headaches, visual symptoms, RUQ/epigastric pain or other concerning symptoms. Discussed need for BP surveillance, will give her BP cuff to do weekly BP checks at home and send values via MyChart. Preeclampsia precautions reviewed. Surveillance labs checked today. - Comprehensive metabolic panel - Protein /  creatinine ratio, urine  2. [redacted] weeks gestation of pregnancy 3. Encounter for supervision of normal first pregnancy in third trimester - Glucose Tolerance, 2 Hours w/1 Hour - CBC - RPR - HIV Antibody (routine testing w rflx) - Tdap vaccine greater than or equal to 7yo IM Preterm labor symptoms and general obstetric precautions including but not limited to vaginal bleeding, contractions, leaking of fluid and fetal movement were reviewed in detail with the patient. Please refer to After Visit Summary for other counseling recommendations.   Return in about 2 weeks (around 06/26/2021) for OFFICE OB VISIT (MD or APP).  Future Appointments  Date Time Provider Department Center  06/12/2021  9:35 AM Cauy Melody, Jethro Bastos, MD [facility] None  06/12/2021  2:30 PM [facility] NURSE [facility]  06/12/2021  2:45 PM [facility] US6 [facility]    Jaynie Collins, MD

## 2021-06-12 NOTE — Patient Instructions (Signed)
Return to office for any scheduled appointments. Call the office or go to the MAU at [facility] if: You begin to have strong, frequent contractions Your water breaks.  Sometimes it is a big gush of fluid, sometimes it is just a trickle that keeps getting your panties wet or running down your legs You have vaginal bleeding.  It is normal to have a small amount of spotting if your cervix was checked.  You do not feel your baby moving like normal.  If you do not, get something to eat and drink and lay down and focus on feeling your baby move.   If your baby is still not moving like normal, you should call the office or go to MAU. Any other obstetric concerns.  Third Trimester of Pregnancy The third trimester of pregnancy is from week 28 through week 40. This is months 7 through 9. The third trimester is a time when the unborn baby (fetus) is growing rapidly. At the end of the ninth month, the fetus is about 20 inches long and weighs 6-10 pounds. Body changes during your third trimester During the third trimester, your body will continue to go through many changes. The changes vary and generally return to normal after your baby is born. Physical changes Your weight will continue to increase. You can expect to gain 25-35 pounds (11-16 kg) by the end of the pregnancy if you begin pregnancy at a normal weight. If you are underweight, you can expect to gain 28-40 lb (about 13-18 kg), and if you are overweight, you can expect to gain 15-25 lb (about 7-11 kg). You may begin to get stretch marks on your hips, abdomen, and breasts. Your breasts will continue to grow and may hurt. A yellow fluid (colostrum) may leak from your breasts. This is the first milk you are producing for your baby. You may have changes in your hair. These can include thickening of your hair, rapid growth, and changes in texture. Some people also have hair loss during or after pregnancy, or hair that feels dry  or thin. Your belly button may stick out. You may notice more swelling in your hands, face, or ankles. Health changes You may have heartburn. You may have constipation. You may develop hemorrhoids. You may develop swollen, bulging veins (varicose veins) in your legs. You may have increased body aches in the pelvis, back, or thighs. This is due to weight gain and increased hormones that are relaxing your joints. You may have increased tingling or numbness in your hands, arms, and legs. The skin on your abdomen may also feel numb. You may feel short of breath because of your expanding uterus. Other changes You may urinate more often because the fetus is moving lower into your pelvis and pressing on your bladder. You may have more problems sleeping. This may be caused by the size of your abdomen, an increased need to urinate, and an increase in your body's metabolism. You may notice the fetus "dropping," or moving lower in your abdomen (lightening). You may have increased vaginal discharge. You may notice that you have pain around your pelvic bone as your uterus distends. Follow these instructions at home: Medicines Follow your health care provider's instructions regarding medicine use. Specific medicines may be either safe or unsafe to take during pregnancy. Do not take any medicines unless approved by your health care provider. Take a prenatal vitamin that contains at least 600 micrograms (mcg) of folic acid. Eating   and drinking Eat a healthy diet that includes fresh fruits and vegetables, whole grains, good sources of protein such as meat, eggs, or tofu, and low-fat dairy products. Avoid raw meat and unpasteurized juice, milk, and cheese. These carry germs that can harm you and your baby. Eat 4 or 5 small meals rather than 3 large meals a day. You may need to take these actions to prevent or treat constipation: Drink enough fluid to keep your urine pale yellow. Eat foods that are high in  fiber, such as beans, whole grains, and fresh fruits and vegetables. Limit foods that are high in fat and processed sugars, such as fried or sweet foods. Activity Exercise only as directed by your health care provider. Most people can continue their usual exercise routine during pregnancy. Try to exercise for 30 minutes at least 5 days a week. Stop exercising if you experience contractions in the uterus. Stop exercising if you develop pain or cramping in the lower abdomen or lower back. Avoid heavy lifting. Do not exercise if it is very hot or humid or if you are at a high altitude. If you choose to, you may continue to have sex unless your health care provider tells you not to. Relieving pain and discomfort Take frequent breaks and rest with your legs raised (elevated) if you have leg cramps or low back pain. Take warm sitz baths to soothe any pain or discomfort caused by hemorrhoids. Use hemorrhoid cream if your health care provider approves. Wear a supportive bra to prevent discomfort from breast tenderness. If you develop varicose veins: Wear support hose as told by your health care provider. Elevate your feet for 15 minutes, 3-4 times a day. Limit salt in your diet. Safety Talk to your health care provider before traveling far distances. Do not use hot tubs, steam rooms, or saunas. Wear your seat belt at all times when driving or riding in a car. Talk with your health care provider if someone is verbally or physically abusive to you. Preparing for birth To prepare for the arrival of your baby: Take prenatal classes to understand, practice, and ask questions about labor and delivery. Visit the hospital and tour the maternity area. Purchase a rear-facing car seat and make sure you know how to install it in your car. Prepare the baby's room or sleeping area. Make sure to remove all pillows and stuffed animals from the baby's crib to prevent suffocation. General instructions Avoid cat  litter boxes and soil used by cats. These carry germs that can cause birth defects in the baby. If you have a cat, ask someone to clean the litter box for you. Do not douche or use tampons. Do not use scented sanitary pads. Do not use any products that contain nicotine or tobacco, such as cigarettes, e-cigarettes, and chewing tobacco. If you need help quitting, ask your health care provider. Do not use any herbal remedies, illegal drugs, or medicines that were not prescribed to you. Chemicals in these products can harm your baby. Do not drink alcohol. You will have more frequent prenatal exams during the third trimester. During a routine prenatal visit, your health care provider will do a physical exam, perform tests, and discuss your overall health. Keep all follow-up visits. This is important. Where to find more information American Pregnancy Association: americanpregnancy.org American College of Obstetricians and Gynecologists: acog.org/en/Womens%20Health/Pregnancy Office on Women's Health: womenshealth.gov/pregnancy Contact a health care provider if you have: A fever. Mild pelvic cramps, pelvic pressure, or nagging pain in   your abdominal area or lower back. Vomiting or diarrhea. Bad-smelling vaginal discharge or foul-smelling urine. Pain when you urinate. A headache that does not go away when you take medicine. Visual changes or see spots in front of your eyes. Get help right away if: Your water breaks. You have regular contractions less than 5 minutes apart. You have spotting or bleeding from your vagina. You have severe abdominal pain. You have difficulty breathing. You have chest pain. You have fainting spells. You have not felt your baby move for the time period told by your health care provider. You have new or increased pain, swelling, or redness in an arm or leg. Summary The third trimester of pregnancy is from week 28 through week 40 (months 7 through 9). You may have more  problems sleeping. This can be caused by the size of your abdomen, an increased need to urinate, and an increase in your body's metabolism. You will have more frequent prenatal exams during the third trimester. Keep all follow-up visits. This is important. This information is not intended to replace advice given to you by your health care provider. Make sure you discuss any questions you have with your health care provider. Document Revised: 01/11/2020 Document Reviewed: 11/17/2019 Elsevier Patient Education  2022 Elsevier Inc.  

## 2021-06-13 ENCOUNTER — Other Ambulatory Visit: Payer: Self-pay | Admitting: *Deleted

## 2021-06-13 DIAGNOSIS — Z6832 Body mass index (BMI) 32.0-32.9, adult: Secondary | ICD-10-CM

## 2021-06-13 DIAGNOSIS — O35EXX0 Maternal care for other (suspected) fetal abnormality and damage, fetal genitourinary anomalies, not applicable or unspecified: Secondary | ICD-10-CM

## 2021-06-13 LAB — COMPREHENSIVE METABOLIC PANEL
ALT: 14 IU/L (ref 0–32)
AST: 13 IU/L (ref 0–40)
Albumin/Globulin Ratio: 1.2 (ref 1.2–2.2)
Albumin: 3.2 g/dL — ABNORMAL LOW (ref 3.9–5.0)
Alkaline Phosphatase: 151 IU/L — ABNORMAL HIGH (ref 44–121)
BUN/Creatinine Ratio: 9 (ref 9–23)
BUN: 5 mg/dL — ABNORMAL LOW (ref 6–20)
Bilirubin Total: 0.3 mg/dL (ref 0.0–1.2)
CO2: 16 mmol/L — ABNORMAL LOW (ref 20–29)
Calcium: 9.1 mg/dL (ref 8.7–10.2)
Chloride: 107 mmol/L — ABNORMAL HIGH (ref 96–106)
Creatinine, Ser: 0.57 mg/dL (ref 0.57–1.00)
Globulin, Total: 2.7 g/dL (ref 1.5–4.5)
Glucose: 87 mg/dL (ref 70–99)
Potassium: 4.2 mmol/L (ref 3.5–5.2)
Sodium: 138 mmol/L (ref 134–144)
Total Protein: 5.9 g/dL — ABNORMAL LOW (ref 6.0–8.5)
eGFR: 128 mL/min/{1.73_m2} (ref >59)

## 2021-06-13 LAB — HIV ANTIBODY (ROUTINE TESTING W REFLEX): HIV Screen 4th Generation wRfx: NONREACTIVE

## 2021-06-13 LAB — CBC
Hematocrit: 37.1 % (ref 34.0–46.6)
Hemoglobin: 11.9 g/dL (ref 11.1–15.9)
MCH: 27 pg (ref 26.6–33.0)
MCHC: 32.1 g/dL (ref 31.5–35.7)
MCV: 84 fL (ref 79–97)
Platelets: 365 10*3/uL (ref 150–450)
RBC: 4.41 x10E6/uL (ref 3.77–5.28)
RDW: 12.2 % (ref 11.7–15.4)
WBC: 10.3 10*3/uL (ref 3.4–10.8)

## 2021-06-13 LAB — PROTEIN / CREATININE RATIO, URINE
Creatinine, Urine: 131.5 mg/dL
Protein, Ur: 21.9 mg/dL
Protein/Creat Ratio: 167 mg/g creat (ref 0–200)

## 2021-06-13 LAB — GLUCOSE TOLERANCE, 2 HOURS W/ 1HR
Glucose, 1 hour: 112 mg/dL (ref 70–179)
Glucose, 2 hour: 99 mg/dL (ref 70–152)
Glucose, Fasting: 85 mg/dL (ref 70–91)

## 2021-06-13 LAB — RPR: RPR Ser Ql: NONREACTIVE

## 2021-06-24 ENCOUNTER — Ambulatory Visit: Payer: BC Managed Care – PPO | Attending: Obstetrics and Gynecology

## 2021-06-24 ENCOUNTER — Ambulatory Visit: Payer: BC Managed Care – PPO | Admitting: *Deleted

## 2021-06-24 ENCOUNTER — Other Ambulatory Visit: Payer: Self-pay

## 2021-06-24 VITALS — BP 122/81 | HR 68

## 2021-06-24 DIAGNOSIS — O359XX0 Maternal care for (suspected) fetal abnormality and damage, unspecified, not applicable or unspecified: Secondary | ICD-10-CM | POA: Insufficient documentation

## 2021-06-24 DIAGNOSIS — Z6832 Body mass index (BMI) 32.0-32.9, adult: Secondary | ICD-10-CM | POA: Diagnosis present

## 2021-06-24 DIAGNOSIS — O99210 Obesity complicating pregnancy, unspecified trimester: Secondary | ICD-10-CM | POA: Insufficient documentation

## 2021-06-24 DIAGNOSIS — O35EXX0 Maternal care for other (suspected) fetal abnormality and damage, fetal genitourinary anomalies, not applicable or unspecified: Secondary | ICD-10-CM | POA: Diagnosis present

## 2021-06-24 DIAGNOSIS — O99213 Obesity complicating pregnancy, third trimester: Secondary | ICD-10-CM | POA: Insufficient documentation

## 2021-06-24 DIAGNOSIS — E669 Obesity, unspecified: Secondary | ICD-10-CM

## 2021-06-24 DIAGNOSIS — Z3403 Encounter for supervision of normal first pregnancy, third trimester: Secondary | ICD-10-CM | POA: Insufficient documentation

## 2021-06-24 DIAGNOSIS — Z3A30 30 weeks gestation of pregnancy: Secondary | ICD-10-CM | POA: Insufficient documentation

## 2021-06-24 DIAGNOSIS — O365930 Maternal care for other known or suspected poor fetal growth, third trimester, not applicable or unspecified: Secondary | ICD-10-CM | POA: Diagnosis not present

## 2021-06-26 ENCOUNTER — Encounter: Payer: BC Managed Care – PPO | Admitting: Women's Health

## 2021-07-03 ENCOUNTER — Encounter: Payer: Self-pay | Admitting: Obstetrics and Gynecology

## 2021-07-03 ENCOUNTER — Other Ambulatory Visit: Payer: Self-pay

## 2021-07-03 ENCOUNTER — Ambulatory Visit (HOSPITAL_BASED_OUTPATIENT_CLINIC_OR_DEPARTMENT_OTHER): Payer: BC Managed Care – PPO

## 2021-07-03 ENCOUNTER — Ambulatory Visit (INDEPENDENT_AMBULATORY_CARE_PROVIDER_SITE_OTHER): Payer: BC Managed Care – PPO | Admitting: Obstetrics and Gynecology

## 2021-07-03 ENCOUNTER — Encounter: Payer: Self-pay | Admitting: *Deleted

## 2021-07-03 ENCOUNTER — Ambulatory Visit: Payer: BC Managed Care – PPO | Admitting: *Deleted

## 2021-07-03 ENCOUNTER — Inpatient Hospital Stay (HOSPITAL_COMMUNITY)
Admission: AD | Admit: 2021-07-03 | Discharge: 2021-07-03 | Disposition: A | Discharge status: Home / Self Care | Payer: BC Managed Care – PPO | Attending: Obstetrics & Gynecology | Admitting: Obstetrics & Gynecology

## 2021-07-03 ENCOUNTER — Other Ambulatory Visit: Payer: Self-pay | Admitting: *Deleted

## 2021-07-03 ENCOUNTER — Encounter (HOSPITAL_COMMUNITY): Payer: Self-pay | Admitting: Obstetrics & Gynecology

## 2021-07-03 VITALS — BP 126/84 | HR 74 | Wt 231.7 lb

## 2021-07-03 VITALS — BP 126/83 | HR 65

## 2021-07-03 DIAGNOSIS — Z3A32 32 weeks gestation of pregnancy: Secondary | ICD-10-CM | POA: Diagnosis not present

## 2021-07-03 DIAGNOSIS — O133 Gestational [pregnancy-induced] hypertension without significant proteinuria, third trimester: Secondary | ICD-10-CM | POA: Diagnosis not present

## 2021-07-03 DIAGNOSIS — O365931 Maternal care for other known or suspected poor fetal growth, third trimester, fetus 1: Secondary | ICD-10-CM

## 2021-07-03 DIAGNOSIS — Z3480 Encounter for supervision of other normal pregnancy, unspecified trimester: Secondary | ICD-10-CM | POA: Insufficient documentation

## 2021-07-03 DIAGNOSIS — O365990 Maternal care for other known or suspected poor fetal growth, unspecified trimester, not applicable or unspecified: Secondary | ICD-10-CM | POA: Insufficient documentation

## 2021-07-03 DIAGNOSIS — O99213 Obesity complicating pregnancy, third trimester: Secondary | ICD-10-CM

## 2021-07-03 DIAGNOSIS — O35EXX0 Maternal care for other (suspected) fetal abnormality and damage, fetal genitourinary anomalies, not applicable or unspecified: Secondary | ICD-10-CM

## 2021-07-03 DIAGNOSIS — O99210 Obesity complicating pregnancy, unspecified trimester: Secondary | ICD-10-CM

## 2021-07-03 DIAGNOSIS — Z6832 Body mass index (BMI) 32.0-32.9, adult: Secondary | ICD-10-CM

## 2021-07-03 DIAGNOSIS — O1493 Unspecified pre-eclampsia, third trimester: Secondary | ICD-10-CM

## 2021-07-03 DIAGNOSIS — O1490 Unspecified pre-eclampsia, unspecified trimester: Secondary | ICD-10-CM | POA: Diagnosis present

## 2021-07-03 DIAGNOSIS — E669 Obesity, unspecified: Secondary | ICD-10-CM | POA: Diagnosis not present

## 2021-07-03 DIAGNOSIS — O359XX0 Maternal care for (suspected) fetal abnormality and damage, unspecified, not applicable or unspecified: Secondary | ICD-10-CM

## 2021-07-03 LAB — COMPREHENSIVE METABOLIC PANEL
ALT: 15 U/L (ref 0–44)
AST: 15 U/L (ref 15–41)
Albumin: 2.3 g/dL — ABNORMAL LOW (ref 3.5–5.0)
Alkaline Phosphatase: 113 U/L (ref 38–126)
Anion gap: 6 (ref 5–15)
BUN: 7 mg/dL (ref 6–20)
CO2: 20 mmol/L — ABNORMAL LOW (ref 22–32)
Calcium: 8.3 mg/dL — ABNORMAL LOW (ref 8.9–10.3)
Chloride: 109 mmol/L (ref 98–111)
Creatinine, Ser: 0.78 mg/dL (ref 0.44–1.00)
GFR, Estimated: >60 mL/min (ref >60)
Glucose, Bld: 86 mg/dL (ref 70–99)
Potassium: 3.8 mmol/L (ref 3.5–5.1)
Sodium: 135 mmol/L (ref 135–145)
Total Bilirubin: 0.3 mg/dL (ref 0.3–1.2)
Total Protein: 5.5 g/dL — ABNORMAL LOW (ref 6.5–8.1)

## 2021-07-03 LAB — CBC
HCT: 35.5 % — ABNORMAL LOW (ref 36.0–46.0)
Hemoglobin: 11.5 g/dL — ABNORMAL LOW (ref 12.0–15.0)
MCH: 27 pg (ref 26.0–34.0)
MCHC: 32.4 g/dL (ref 30.0–36.0)
MCV: 83.3 fL (ref 80.0–100.0)
Platelets: 334 10*3/uL (ref 150–400)
RBC: 4.26 MIL/uL (ref 3.87–5.11)
RDW: 12.6 % (ref 11.5–15.5)
WBC: 10.7 10*3/uL — ABNORMAL HIGH (ref 4.0–10.5)
nRBC: 0.2 % (ref 0.0–0.2)

## 2021-07-03 LAB — PROTEIN / CREATININE RATIO, URINE
Creatinine, Urine: 324.74 mg/dL
Protein Creatinine Ratio: 1.83 mg/mg{Cre} — ABNORMAL HIGH (ref 0.00–0.15)
Total Protein, Urine: 595 mg/dL

## 2021-07-03 NOTE — MAU Provider Note (Addendum)
History     CSN: [id number]  Arrival date and time: 07/03/21 1742   Event Date/Time   First Provider Initiated Contact with Patient 07/03/21 1832      Chief Complaint  Patient presents with   Hypertension   HPI Stephanie [name] is a 25 y.o. G2P0010 at [redacted]w[redacted]d who presents to MAU from [facility] for evaluation of new onset elevated blood pressure of 145/84. She denies headache, visual disturbances, RUQ/epigastric pain, new onset swelling or weight gain. She denies abdominal pain, vaginal bleeding, DFM, dysuria.  Patient receives care with [facility].  OB History     Gravida  2   Para      Term      Preterm      AB  1   Living         SAB  1   IAB      Ectopic      Multiple      Live Births              Past Medical History:  Diagnosis Date   Medical history non-contributory     Past Surgical History:  Procedure Laterality Date   WISDOM TOOTH EXTRACTION Bilateral     Family History  Problem Relation Age of Onset   Asthma Brother    Diabetes Maternal Grandmother    Migraines Mother     Social History   Tobacco Use   Smoking status: Never   Smokeless tobacco: Never  Vaping Use   Vaping Use: Never used  Substance Use Topics   Alcohol use: Not Currently   Drug use: Never    Allergies: No Known Allergies  Medications Prior to Admission  Medication Sig Dispense Refill Last Dose   acetaminophen (TYLENOL) 500 MG tablet Take 500 mg by mouth every 6 (six) hours as needed.   07/03/2021   calcium carbonate (TUMS - DOSED IN MG ELEMENTAL CALCIUM) 500 MG chewable tablet Chew 1 tablet by mouth daily.   07/03/2021   Prenatal Vit-Fe Fumarate-FA (PRENATAL MULTIVITAMIN) TABS tablet Take 1 tablet by mouth daily at 12 noon.   07/03/2021    Review of Systems  Eyes:  Negative for photophobia and visual disturbance.  Respiratory:  Negative for chest tightness.   Gastrointestinal:  Negative for abdominal pain.  Neurological:  Negative for headaches.   All other systems reviewed and are negative. Physical Exam   Blood pressure 124/83, pulse 66, temperature 98.2 F (36.8 C), resp. rate 18, last menstrual period 11/21/2020, unknown if currently breastfeeding.  Physical Exam Vitals and nursing note reviewed. Exam conducted with a chaperone present.  Constitutional:      General: She is not in acute distress.    Appearance: Normal appearance. She is obese. She is not ill-appearing.  Cardiovascular:     Rate and Rhythm: Normal rate and regular rhythm.     Pulses: Normal pulses.     Heart sounds: Normal heart sounds.  Pulmonary:     Effort: Pulmonary effort is normal.     Breath sounds: Normal breath sounds.  Abdominal:     Comments: Gravid  Skin:    Capillary Refill: Capillary refill takes less than 2 seconds.  Neurological:     Mental Status: She is alert and oriented to person, place, and time.  Psychiatric:        Mood and Affect: Mood normal.        Behavior: Behavior normal.        Thought Content: Thought  content normal.        Judgment: Judgment normal.    MAU Course  Procedures  Orders Placed This Encounter  Procedures   Protein / creatinine ratio, urine   CBC   Comprehensive metabolic panel   Measure blood pressure   Patient Vitals for the past 24 hrs:  BP Temp Pulse Resp  07/03/21 2000 122/79 -- 64 --  07/03/21 1930 125/84 -- 65 --  07/03/21 1915 124/83 -- 66 --  07/03/21 1900 123/84 -- 70 --  07/03/21 1846 125/84 -- 72 --  07/03/21 1831 123/72 -- 67 --  07/03/21 1816 118/79 98.2 F (36.8 C) 72 18   Report given to M. Jimmye Norman who assumes care of patient at this time.  Mallie Snooks, MSA, MSN, CNM Certified Nurse Midwife, [facility] for [employer], [employer] 07/03/21 8:14 PM Results for orders placed or performed during the hospital encounter of 07/03/21 (from the past 24 hour(s))  CBC     Status: Abnormal   Collection Time: 07/03/21  6:13 PM  Result Value  Ref Range   WBC 10.7 (H) 4.0 - 10.5 K/uL   RBC 4.26 3.87 - 5.11 MIL/uL   Hemoglobin 11.5 (L) 12.0 - 15.0 g/dL   HCT 35.5 (L) 36.0 - 46.0 %   MCV 83.3 80.0 - 100.0 fL   MCH 27.0 26.0 - 34.0 pg   MCHC 32.4 30.0 - 36.0 g/dL   RDW 12.6 11.5 - 15.5 %   Platelets 334 150 - 400 K/uL   nRBC 0.2 0.0 - 0.2 %  Comprehensive metabolic panel     Status: Abnormal   Collection Time: 07/03/21  6:13 PM  Result Value Ref Range   Sodium 135 135 - 145 mmol/L   Potassium 3.8 3.5 - 5.1 mmol/L   Chloride 109 98 - 111 mmol/L   CO2 20 (L) 22 - 32 mmol/L   Glucose, Bld 86 70 - 99 mg/dL   BUN 7 6 - 20 mg/dL   Creatinine, Ser 0.78 0.44 - 1.00 mg/dL   Calcium 8.3 (L) 8.9 - 10.3 mg/dL   Total Protein 5.5 (L) 6.5 - 8.1 g/dL   Albumin 2.3 (L) 3.5 - 5.0 g/dL   AST 15 15 - 41 U/L   ALT 15 0 - 44 U/L   Alkaline Phosphatase 113 38 - 126 U/L   Total Bilirubin 0.3 0.3 - 1.2 mg/dL   GFR, Estimated >60 >60 mL/min   Anion gap 6 5 - 15  Protein / creatinine ratio, urine     Status: Abnormal   Collection Time: 07/03/21  6:21 PM  Result Value Ref Range   Creatinine, Urine 324.74 mg/dL   Total Protein, Urine 595 mg/dL   Protein Creatinine Ratio 1.83 (H) 0.00 - 0.15 mg/mg[Cre]   Vitals:   07/03/21 2030 07/03/21 2130 07/03/21 2131 07/03/21 2146  BP: 128/77 126/81 125/89 126/80  Pulse: 61 68 (!) 152 66  Resp:      Temp:       Reviewed results with Dr Harolyn Rutherford Explained preeclampsia to patient and her husband Already scheduled for MFM evaluations due to [place] Warning signs of worsening preeclampsia reviewed    Assessment and Plan  A:  Single IUP  at [redacted]w[redacted]d       Preeclampsia       Reactive nonstress test and BPP  P:   Discharge home        Preeclampsia precautions        Monitor  fetal movement patterns       Follow up in office as scheduled       Encouraged to return if she develops worsening of symptoms, increase in pain, fever, or other concerning symptoms.   Aviva Signs, CNM

## 2021-07-03 NOTE — Progress Notes (Signed)
ROB 32 wks Reports large amount of swelling in legs, ankles, feet. Some swelling in hands and face. Reports rapid weight gain in short period. Denies headache, visual changes, heartburn/ RUQ abd pain.

## 2021-07-03 NOTE — MAU Note (Signed)
Pt sent from office for elevated b/p. Had MFM appointment earlier and b/p was normal  at appointment was elevated (see flow sheet). Reports increased swelling in her legs and feet. Denies any headache or visual disturbances. Good fetal movement reported denies any vag bleeding or leaking.

## 2021-07-03 NOTE — Progress Notes (Signed)
   PRENATAL VISIT NOTE  Subjective:  Stephanie [name] is a 25 y.o. G2P0010 at [redacted]w[redacted]d being seen today for ongoing prenatal care.  She is currently monitored for the following issues for this high-risk pregnancy and has Learning problem; Supervision of other normal pregnancy, antepartum; Fetal abnormality in antepartum pregnancy; Obesity in pregnancy, antepartum; and Fetal growth restriction antepartum on their problem list.  Patient reports  swelling that is significant over the last couple weeks. Denies HA/BV.  Marland Kitchen  Contractions: Not present. Vag. Bleeding: None.  Movement: Present. Denies leaking of fluid.   The following portions of the patient's history were reviewed and updated as appropriate: allergies, current medications, past family history, past medical history, past social history, past surgical history and problem list.   Objective:   Vitals:   07/03/21 1639 07/03/21 1648  BP: (!) 145/84 126/84  Pulse: 76 74  Weight: 231 lb 11.2 oz (105.1 kg)     Fetal Status:     Movement: Present     General:  Alert, oriented and cooperative. Patient is in no acute distress.  Skin: Skin is warm and dry. No rash noted.   Cardiovascular: Normal heart rate noted  Respiratory: Normal respiratory effort, no problems with respiration noted  Abdomen: Soft, gravid, appropriate for gestational age.  Pain/Pressure: Absent     Pelvic: Cervical exam deferred        Extremities: Normal range of motion.  Edema: Deep pitting, indentation remains for a short time  Mental Status: Normal mood and affect. Normal behavior. Normal judgment and thought content.   Assessment and Plan:  Pregnancy: G2P0010 at [redacted]w[redacted]d 1. Supervision of other normal pregnancy, antepartum - Up to date on routine care. 28w labs wnl. No anemia.  - BP was normal last appt, but borderline. PreE labs normal. Today, BP is elevated at 145/84. Earlier today was 126/83 during BPP/UADs. Given new onset FGR, elevated BP, will have her go to MAU  for evaluation for possible PreE vs gHTN. She is otherwise asymptomatic today.  - She has a blood pressure cuff at home  2. Fetal abnormality in antepartum pregnancy, single or unspecified fetus - Fetal pelvic kidney - evaluate after delivery  3. Obesity in pregnancy, antepartum  4. Fetal growth restriction antepartum - Dopplers/BPP thus far normal. Continue weekly along with serial growths. Last growth was 11/1 which is when she was diagnosed. AC normal, AFI normal.  - UAD/BPP report today pending.  - Will ensure she starts NSTs  Preterm labor symptoms and general obstetric precautions including but not limited to vaginal bleeding, contractions, leaking of fluid and fetal movement were reviewed in detail with the patient. Please refer to After Visit Summary for other counseling recommendations.   Return in about 2 weeks (around 07/17/2021) for Needs BPP/NST weekly.  Future Appointments  Date Time Provider Department Center  07/10/2021  8:30 AM [facility] NURSE [facility]  07/10/2021  8:45 AM [facility] US5 [facility]  07/17/2021 10:45 AM [facility] NURSE [facility]  07/17/2021 11:00 AM [facility] US1 [facility]  07/17/2021  2:50 PM Nugent, Odie Sera, NP [facility]  07/24/2021  8:30 AM [facility] NURSE [facility]  07/24/2021  8:45 AM [facility] US5 [facility]  07/31/2021 12:45 PM [facility] NURSE [facility]  07/31/2021  1:00 PM [facility] US1 [facility]    Milas Hock, MD

## 2021-07-04 ENCOUNTER — Other Ambulatory Visit: Payer: Self-pay

## 2021-07-04 ENCOUNTER — Inpatient Hospital Stay (HOSPITAL_COMMUNITY)
Admission: AD | Admit: 2021-07-04 | Discharge: 2021-07-04 | Disposition: A | Discharge status: Home / Self Care | Payer: BC Managed Care – PPO | Attending: Obstetrics and Gynecology | Admitting: Obstetrics and Gynecology

## 2021-07-04 ENCOUNTER — Encounter (HOSPITAL_COMMUNITY): Payer: Self-pay | Admitting: Obstetrics and Gynecology

## 2021-07-04 DIAGNOSIS — O1493 Unspecified pre-eclampsia, third trimester: Secondary | ICD-10-CM | POA: Insufficient documentation

## 2021-07-04 DIAGNOSIS — Z3480 Encounter for supervision of other normal pregnancy, unspecified trimester: Secondary | ICD-10-CM

## 2021-07-04 DIAGNOSIS — O133 Gestational [pregnancy-induced] hypertension without significant proteinuria, third trimester: Secondary | ICD-10-CM | POA: Diagnosis present

## 2021-07-04 DIAGNOSIS — Z3A32 32 weeks gestation of pregnancy: Secondary | ICD-10-CM | POA: Insufficient documentation

## 2021-07-04 DIAGNOSIS — O99210 Obesity complicating pregnancy, unspecified trimester: Secondary | ICD-10-CM

## 2021-07-04 DIAGNOSIS — O99213 Obesity complicating pregnancy, third trimester: Secondary | ICD-10-CM | POA: Insufficient documentation

## 2021-07-04 NOTE — MAU Note (Signed)
Pt was here last night with increased b/ps. Tonight b/p was 152/93 and then 150/95. Baby has been moving as much as usual but kicks are not as hard. Denies VB or LOF. Alittle dizzy earlier and vision was blurry earlier but took a nap and that went away. Seems very anxious that she was diagnosed with Preeclampsia yesterday.

## 2021-07-04 NOTE — MAU Provider Note (Signed)
Chief Complaint:  Hypertension   Event Date/Time   First Provider Initiated Contact with Patient 07/04/21 2307     HPI  HPI: Stephanie [name] is a 25 y.o. G2P0010 at 26w1dwho presents to maternity admissions reporting elevated BP readings at home  Was diagnosed yesterday with hypertension and is worried because systolics were higher. .No new headache or visual changes She reports good fetal movement, denies LOF, vaginal bleeding, vaginal itching/burning, urinary symptoms, h/a, dizziness, n/v, diarrhea, constipation or fever/chills.    RN Note: Pt was here last night with increased b/ps. Tonight b/p was 152/93 and then 150/95. Baby has been moving as much as usual but kicks are not as hard. Denies VB or LOF. Alittle dizzy earlier and vision was blurry earlier but took a nap and that went away. Seems very anxious that she was diagnosed with Preeclampsia yesterday.   Past Medical History: Past Medical History:  Diagnosis Date   Medical history non-contributory     Past obstetric history: OB History  Gravida Para Term Preterm AB Living  2 0 0 0 1 0  SAB IAB Ectopic Multiple Live Births  1 0 0 0 0    # Outcome Date GA Lbr Len/2nd Weight Sex Delivery Anes PTL Lv  2 Current           1 SAB             Past Surgical History: Past Surgical History:  Procedure Laterality Date   WISDOM TOOTH EXTRACTION Bilateral     Family History: Family History  Problem Relation Age of Onset   Asthma Brother    Diabetes Maternal Grandmother    Migraines Mother     Social History: Social History   Tobacco Use   Smoking status: Never   Smokeless tobacco: Never  Vaping Use   Vaping Use: Never used  Substance Use Topics   Alcohol use: Not Currently   Drug use: Never    Allergies: No Known Allergies  Meds:  Medications Prior to Admission  Medication Sig Dispense Refill Last Dose   calcium carbonate (TUMS - DOSED IN MG ELEMENTAL CALCIUM) 500 MG chewable tablet Chew 1 tablet by mouth  daily.   07/03/2021   Prenatal Vit-Fe Fumarate-FA (PRENATAL MULTIVITAMIN) TABS tablet Take 1 tablet by mouth daily at 12 noon.   Past Week   acetaminophen (TYLENOL) 500 MG tablet Take 500 mg by mouth every 6 (six) hours as needed.   More than a month    I have reviewed patient's Past Medical Hx, Surgical Hx, Family Hx, Social Hx, medications and allergies.   ROS:  Review of Systems  Constitutional:  Negative for chills and fever.  Eyes:  Negative for visual disturbance.  Respiratory:  Negative for shortness of breath.   Cardiovascular:  Negative for leg swelling.  Genitourinary:  Negative for pelvic pain.  Neurological:  Negative for weakness and headaches.  Other systems negative  Physical Exam  Patient Vitals for the past 24 hrs:  BP Temp Pulse Resp SpO2 Height Weight  07/04/21 2245 135/80 -- 71 -- 97 % -- --  07/04/21 2240 140/82 -- 65 20 98 % -- --  07/04/21 2230 131/85 -- -- -- -- -- --  07/04/21 2227 137/82 -- -- -- -- -- --  07/04/21 2223 -- 98 F (36.7 C) 66 -- 100 % 5\' 3"  (1.6 m) 106.6 kg   Constitutional: Well-developed, well-nourished female in no acute distress.  Cardiovascular: normal rate and rhythm Respiratory: normal effort, clear to  auscultation bilaterally GI: Abd soft, non-tender, gravid appropriate for gestational age.   No rebound or guarding. MS: Extremities nontender, Trace edema, normal ROM Neurologic: Alert and oriented x 4. DTRs 2+ with no clonus GU: Neg CVAT.    FHT:  Baseline 140 , moderate variability, accelerations present, no decelerations Contractions:   Rare   Labs: No results found for this or any previous visit (from the past 24 hour(s)). O/Positive/-- (06/10 1050) Labs not repeated since done earlier  Imaging:    MAU Course/MDM: I have reviewed results.  NST reviewed, reactive.  Treatments in MAU included BP Monitoring  Discussed ranges of HTN we are most concerned about.  Discussed we might want to try a large cuff at home as  BPs here measure lower.  Reviewed preeclampsia in detail .    Assessment: S[place] IUP at.[redacted]w[redacted]d Preeclampsia  Plan: Discharge home Preeclampsia precautions Preterm Labor precautions and fetal kick counts Follow up in Office for prenatal visits and recheck status Encouraged to return if she develops worsening of symptoms, increase in pain, fever, or other concerning symptoms.  Pt stable at time of discharge.  Hansel Feinstein CNM, MSN Certified Nurse-Midwife 07/04/2021 11:07 PM

## 2021-07-08 ENCOUNTER — Encounter: Payer: Self-pay | Admitting: Obstetrics

## 2021-07-08 ENCOUNTER — Other Ambulatory Visit: Payer: Self-pay

## 2021-07-08 ENCOUNTER — Ambulatory Visit (INDEPENDENT_AMBULATORY_CARE_PROVIDER_SITE_OTHER): Payer: BC Managed Care – PPO | Admitting: *Deleted

## 2021-07-08 VITALS — BP 138/86 | HR 65

## 2021-07-08 DIAGNOSIS — O1493 Unspecified pre-eclampsia, third trimester: Secondary | ICD-10-CM

## 2021-07-08 DIAGNOSIS — Z3A32 32 weeks gestation of pregnancy: Secondary | ICD-10-CM

## 2021-07-08 NOTE — Progress Notes (Signed)
Subjective:  Stephanie [name] is a 25 y.o. female here for BP check. Reports 130-140/100-105 readings with home BP monitor.  Hypertension ROS:  Denies headache, blurry vision, dizziness, RUQ pain. 2+ pitting edema noted lower extremities. Reports fetal movement.  BP 142/91 P 61 and second reading BP 138/86 P 65 Home BP monitor reading 116/80  FHTs 138-146 bpm, fetal movement palpated  Objective:  LMP 11/21/2020   Appearance alert, well appearing, and in no distress, oriented to person, place, and time, overweight, and anxious. General exam BP noted to be well controlled today in office.    Assessment:   Blood Pressure stable.   Plan:  Current treatment plan is effective, no change in therapy. Dr. Jolayne Panther reviewed BP reading and recent MAU visit notes and labs. Plan to continue current treatment: no indication for BP meds or delivery prior to 37 weeks at this time, continue weekly NST and BPP as indicated by MFM. Reviewed plan of care for pre eclampsia in preterm patients with patient and husband. Reassured that all testing indicates that mom and baby are safe to continue pregnancy. Advised increased fluid intake and compression stockings as needed for swelling. Encouraged rest and relaxation. Encouraged home BP readings no more often than once daily and reminded that BP is being monitored regularly in the office and at MFM.

## 2021-07-10 ENCOUNTER — Encounter: Payer: Self-pay | Admitting: *Deleted

## 2021-07-10 ENCOUNTER — Ambulatory Visit: Payer: BC Managed Care – PPO | Attending: Obstetrics and Gynecology

## 2021-07-10 ENCOUNTER — Other Ambulatory Visit: Payer: Self-pay

## 2021-07-10 ENCOUNTER — Other Ambulatory Visit: Payer: Self-pay | Admitting: *Deleted

## 2021-07-10 ENCOUNTER — Ambulatory Visit: Payer: BC Managed Care – PPO | Admitting: *Deleted

## 2021-07-10 VITALS — BP 129/81 | HR 65

## 2021-07-10 DIAGNOSIS — E669 Obesity, unspecified: Secondary | ICD-10-CM | POA: Diagnosis not present

## 2021-07-10 DIAGNOSIS — O1493 Unspecified pre-eclampsia, third trimester: Secondary | ICD-10-CM | POA: Diagnosis present

## 2021-07-10 DIAGNOSIS — O35EXX0 Maternal care for other (suspected) fetal abnormality and damage, fetal genitourinary anomalies, not applicable or unspecified: Secondary | ICD-10-CM

## 2021-07-10 DIAGNOSIS — Z6832 Body mass index (BMI) 32.0-32.9, adult: Secondary | ICD-10-CM

## 2021-07-10 DIAGNOSIS — O359XX0 Maternal care for (suspected) fetal abnormality and damage, unspecified, not applicable or unspecified: Secondary | ICD-10-CM | POA: Insufficient documentation

## 2021-07-10 DIAGNOSIS — O365930 Maternal care for other known or suspected poor fetal growth, third trimester, not applicable or unspecified: Secondary | ICD-10-CM | POA: Diagnosis not present

## 2021-07-10 DIAGNOSIS — O99213 Obesity complicating pregnancy, third trimester: Secondary | ICD-10-CM | POA: Diagnosis not present

## 2021-07-10 DIAGNOSIS — Z3480 Encounter for supervision of other normal pregnancy, unspecified trimester: Secondary | ICD-10-CM | POA: Diagnosis present

## 2021-07-10 DIAGNOSIS — Z3A33 33 weeks gestation of pregnancy: Secondary | ICD-10-CM | POA: Diagnosis not present

## 2021-07-10 DIAGNOSIS — O99210 Obesity complicating pregnancy, unspecified trimester: Secondary | ICD-10-CM

## 2021-07-17 ENCOUNTER — Other Ambulatory Visit: Payer: Self-pay

## 2021-07-17 ENCOUNTER — Ambulatory Visit: Payer: BC Managed Care – PPO | Admitting: *Deleted

## 2021-07-17 ENCOUNTER — Inpatient Hospital Stay (HOSPITAL_COMMUNITY): Payer: BC Managed Care – PPO | Admitting: Anesthesiology

## 2021-07-17 ENCOUNTER — Encounter: Payer: Self-pay | Admitting: *Deleted

## 2021-07-17 ENCOUNTER — Encounter (HOSPITAL_COMMUNITY)
Admission: AD | Disposition: A | Discharge status: Home / Self Care | Payer: Self-pay | Source: Home / Self Care | Attending: Obstetrics & Gynecology

## 2021-07-17 ENCOUNTER — Encounter: Payer: BC Managed Care – PPO | Admitting: Women's Health

## 2021-07-17 ENCOUNTER — Ambulatory Visit (HOSPITAL_BASED_OUTPATIENT_CLINIC_OR_DEPARTMENT_OTHER): Payer: BC Managed Care – PPO

## 2021-07-17 ENCOUNTER — Ambulatory Visit: Payer: BC Managed Care – PPO

## 2021-07-17 ENCOUNTER — Ambulatory Visit (HOSPITAL_BASED_OUTPATIENT_CLINIC_OR_DEPARTMENT_OTHER): Payer: BC Managed Care – PPO | Admitting: Obstetrics and Gynecology

## 2021-07-17 ENCOUNTER — Encounter (HOSPITAL_COMMUNITY): Payer: Self-pay | Admitting: Obstetrics and Gynecology

## 2021-07-17 ENCOUNTER — Inpatient Hospital Stay (HOSPITAL_COMMUNITY)
Admission: AD | Admit: 2021-07-17 | Discharge: 2021-07-20 | DRG: 788 | Disposition: A | Discharge status: Home / Self Care | Payer: BC Managed Care – PPO | Attending: Obstetrics & Gynecology | Admitting: Obstetrics & Gynecology

## 2021-07-17 ENCOUNTER — Other Ambulatory Visit: Payer: Self-pay | Admitting: Obstetrics and Gynecology

## 2021-07-17 VITALS — BP 130/84 | HR 82

## 2021-07-17 DIAGNOSIS — O365990 Maternal care for other known or suspected poor fetal growth, unspecified trimester, not applicable or unspecified: Secondary | ICD-10-CM | POA: Diagnosis present

## 2021-07-17 DIAGNOSIS — O368130 Decreased fetal movements, third trimester, not applicable or unspecified: Principal | ICD-10-CM | POA: Diagnosis present

## 2021-07-17 DIAGNOSIS — Z3A34 34 weeks gestation of pregnancy: Secondary | ICD-10-CM | POA: Insufficient documentation

## 2021-07-17 DIAGNOSIS — Z6832 Body mass index (BMI) 32.0-32.9, adult: Secondary | ICD-10-CM

## 2021-07-17 DIAGNOSIS — O99213 Obesity complicating pregnancy, third trimester: Secondary | ICD-10-CM | POA: Insufficient documentation

## 2021-07-17 DIAGNOSIS — O35EXX0 Maternal care for other (suspected) fetal abnormality and damage, fetal genitourinary anomalies, not applicable or unspecified: Secondary | ICD-10-CM

## 2021-07-17 DIAGNOSIS — O1413 Severe pre-eclampsia, third trimester: Secondary | ICD-10-CM | POA: Diagnosis not present

## 2021-07-17 DIAGNOSIS — Z3490 Encounter for supervision of normal pregnancy, unspecified, unspecified trimester: Secondary | ICD-10-CM

## 2021-07-17 DIAGNOSIS — O1493 Unspecified pre-eclampsia, third trimester: Secondary | ICD-10-CM

## 2021-07-17 DIAGNOSIS — O365930 Maternal care for other known or suspected poor fetal growth, third trimester, not applicable or unspecified: Secondary | ICD-10-CM | POA: Insufficient documentation

## 2021-07-17 DIAGNOSIS — Z20822 Contact with and (suspected) exposure to covid-19: Secondary | ICD-10-CM | POA: Diagnosis present

## 2021-07-17 DIAGNOSIS — O99210 Obesity complicating pregnancy, unspecified trimester: Secondary | ICD-10-CM

## 2021-07-17 DIAGNOSIS — Z3480 Encounter for supervision of other normal pregnancy, unspecified trimester: Secondary | ICD-10-CM

## 2021-07-17 DIAGNOSIS — O359XX0 Maternal care for (suspected) fetal abnormality and damage, unspecified, not applicable or unspecified: Secondary | ICD-10-CM | POA: Insufficient documentation

## 2021-07-17 DIAGNOSIS — O1414 Severe pre-eclampsia complicating childbirth: Secondary | ICD-10-CM | POA: Diagnosis present

## 2021-07-17 DIAGNOSIS — O1410 Severe pre-eclampsia, unspecified trimester: Secondary | ICD-10-CM | POA: Diagnosis present

## 2021-07-17 DIAGNOSIS — O99214 Obesity complicating childbirth: Secondary | ICD-10-CM | POA: Diagnosis present

## 2021-07-17 LAB — CBC
HCT: 42 % (ref 36.0–46.0)
Hemoglobin: 13.6 g/dL (ref 12.0–15.0)
MCH: 27.1 pg (ref 26.0–34.0)
MCHC: 32.4 g/dL (ref 30.0–36.0)
MCV: 83.7 fL (ref 80.0–100.0)
Platelets: 414 10*3/uL — ABNORMAL HIGH (ref 150–400)
RBC: 5.02 MIL/uL (ref 3.87–5.11)
RDW: 13.7 % (ref 11.5–15.5)
WBC: 12.1 10*3/uL — ABNORMAL HIGH (ref 4.0–10.5)
nRBC: 0 % (ref 0.0–0.2)

## 2021-07-17 LAB — COMPREHENSIVE METABOLIC PANEL
ALT: 11 U/L (ref 0–44)
AST: 16 U/L (ref 15–41)
Albumin: 2.2 g/dL — ABNORMAL LOW (ref 3.5–5.0)
Alkaline Phosphatase: 142 U/L — ABNORMAL HIGH (ref 38–126)
Anion gap: 10 (ref 5–15)
BUN: 9 mg/dL (ref 6–20)
CO2: 18 mmol/L — ABNORMAL LOW (ref 22–32)
Calcium: 8.1 mg/dL — ABNORMAL LOW (ref 8.9–10.3)
Chloride: 109 mmol/L (ref 98–111)
Creatinine, Ser: 0.85 mg/dL (ref 0.44–1.00)
GFR, Estimated: >60 mL/min (ref >60)
Glucose, Bld: 82 mg/dL (ref 70–99)
Potassium: 4.2 mmol/L (ref 3.5–5.1)
Sodium: 137 mmol/L (ref 135–145)
Total Bilirubin: 0.7 mg/dL (ref 0.3–1.2)
Total Protein: 5.6 g/dL — ABNORMAL LOW (ref 6.5–8.1)

## 2021-07-17 LAB — TYPE AND SCREEN
ABO/RH(D): O POS
Antibody Screen: NEGATIVE

## 2021-07-17 LAB — RESP PANEL BY RT-PCR (FLU A&B, COVID) ARPGX2
Influenza A by PCR: NEGATIVE
Influenza B by PCR: NEGATIVE
SARS Coronavirus 2 by RT PCR: NEGATIVE

## 2021-07-17 SURGERY — Surgical Case
Anesthesia: Spinal | Wound class: Clean Contaminated

## 2021-07-17 MED ORDER — CEFAZOLIN SODIUM-DEXTROSE 2-3 GM-%(50ML) IV SOLR
INTRAVENOUS | Status: DC | PRN
  Administered 2021-07-17: 2 g via INTRAVENOUS

## 2021-07-17 MED ORDER — OXYTOCIN-SODIUM CHLORIDE 30-0.9 UT/500ML-% IV SOLN
2.5000 [IU]/h | INTRAVENOUS | Status: DC
  Administered 2021-07-17: 30 [IU] via INTRAVENOUS

## 2021-07-17 MED ORDER — KETOROLAC TROMETHAMINE 30 MG/ML IJ SOLN
INTRAMUSCULAR | Status: AC
  Filled 2021-07-17: qty 1

## 2021-07-17 MED ORDER — FENTANYL CITRATE (PF) 100 MCG/2ML IJ SOLN
INTRAMUSCULAR | Status: AC
  Filled 2021-07-17: qty 2

## 2021-07-17 MED ORDER — LACTATED RINGERS IV SOLN: 500.0000 mL | INTRAVENOUS | Status: DC | PRN

## 2021-07-17 MED ORDER — OXYCODONE-ACETAMINOPHEN 5-325 MG PO TABS: 2.0000 | ORAL_TABLET | ORAL | Status: DC | PRN

## 2021-07-17 MED ORDER — WITCH HAZEL-GLYCERIN EX PADS: 1.0000 "application " | MEDICATED_PAD | CUTANEOUS | Status: DC | PRN

## 2021-07-17 MED ORDER — SOD CITRATE-CITRIC ACID 500-334 MG/5ML PO SOLN: 30.0000 mL | ORAL | Status: DC

## 2021-07-17 MED ORDER — PRENATAL MULTIVITAMIN CH
1.0000 | ORAL_TABLET | Freq: Every day | ORAL | Status: DC
  Administered 2021-07-18 – 2021-07-19 (×2): 1 via ORAL
  Filled 2021-07-17 (×2): qty 1

## 2021-07-17 MED ORDER — MORPHINE SULFATE (PF) 0.5 MG/ML IJ SOLN
INTRAMUSCULAR | Status: DC | PRN
  Administered 2021-07-17: 150 ug via INTRATHECAL

## 2021-07-17 MED ORDER — ONDANSETRON HCL 4 MG/2ML IJ SOLN: 4.0000 mg | Freq: Four times a day (QID) | INTRAMUSCULAR | Status: DC | PRN

## 2021-07-17 MED ORDER — CEFAZOLIN SODIUM-DEXTROSE 2-4 GM/100ML-% IV SOLN
INTRAVENOUS | Status: AC
  Filled 2021-07-17: qty 100

## 2021-07-17 MED ORDER — LACTATED RINGERS IV SOLN: INTRAVENOUS | Status: DC

## 2021-07-17 MED ORDER — MEASLES, MUMPS & RUBELLA VAC IJ SOLR: 0.5000 mL | Freq: Once | INTRAMUSCULAR | Status: DC

## 2021-07-17 MED ORDER — SOD CITRATE-CITRIC ACID 500-334 MG/5ML PO SOLN
30.0000 mL | ORAL | Status: DC | PRN
  Filled 2021-07-17: qty 30

## 2021-07-17 MED ORDER — ONDANSETRON HCL 4 MG/2ML IJ SOLN
INTRAMUSCULAR | Status: AC
  Filled 2021-07-17: qty 2

## 2021-07-17 MED ORDER — ACETAMINOPHEN 500 MG PO TABS
1000.0000 mg | ORAL_TABLET | Freq: Four times a day (QID) | ORAL | Status: DC
  Administered 2021-07-17 – 2021-07-20 (×10): 1000 mg via ORAL
  Filled 2021-07-17 (×11): qty 2

## 2021-07-17 MED ORDER — ONDANSETRON HCL 4 MG/2ML IJ SOLN: 4.0000 mg | Freq: Three times a day (TID) | INTRAMUSCULAR | Status: DC | PRN

## 2021-07-17 MED ORDER — SODIUM CHLORIDE 0.9% FLUSH: 3.0000 mL | INTRAVENOUS | Status: DC | PRN

## 2021-07-17 MED ORDER — SIMETHICONE 80 MG PO CHEW
80.0000 mg | CHEWABLE_TABLET | Freq: Three times a day (TID) | ORAL | Status: DC
  Administered 2021-07-18 – 2021-07-20 (×7): 80 mg via ORAL
  Filled 2021-07-17 (×7): qty 1

## 2021-07-17 MED ORDER — DIPHENHYDRAMINE HCL 25 MG PO CAPS: 25.0000 mg | ORAL_CAPSULE | ORAL | Status: DC | PRN

## 2021-07-17 MED ORDER — ENOXAPARIN SODIUM 60 MG/0.6ML IJ SOSY
50.0000 mg | PREFILLED_SYRINGE | INTRAMUSCULAR | Status: DC
  Administered 2021-07-18 – 2021-07-20 (×3): 50 mg via SUBCUTANEOUS
  Filled 2021-07-17 (×3): qty 0.6

## 2021-07-17 MED ORDER — NALBUPHINE HCL 10 MG/ML IJ SOLN: 5.0000 mg | INTRAMUSCULAR | Status: DC | PRN

## 2021-07-17 MED ORDER — DIPHENHYDRAMINE HCL 25 MG PO CAPS: 25.0000 mg | ORAL_CAPSULE | Freq: Four times a day (QID) | ORAL | Status: DC | PRN

## 2021-07-17 MED ORDER — ACETAMINOPHEN 325 MG PO TABS: 650.0000 mg | ORAL_TABLET | ORAL | Status: DC | PRN

## 2021-07-17 MED ORDER — LABETALOL HCL 5 MG/ML IV SOLN: 40.0000 mg | INTRAVENOUS | Status: DC | PRN

## 2021-07-17 MED ORDER — OXYCODONE HCL 5 MG PO TABS: 5.0000 mg | ORAL_TABLET | Freq: Once | ORAL | Status: DC | PRN

## 2021-07-17 MED ORDER — MAGNESIUM SULFATE 40 GM/1000ML IV SOLN
INTRAVENOUS | Status: AC
  Filled 2021-07-17: qty 1000

## 2021-07-17 MED ORDER — MENTHOL 3 MG MT LOZG: 1.0000 | LOZENGE | OROMUCOSAL | Status: DC | PRN

## 2021-07-17 MED ORDER — OXYTOCIN-SODIUM CHLORIDE 30-0.9 UT/500ML-% IV SOLN: 2.5000 [IU]/h | INTRAVENOUS | Status: AC

## 2021-07-17 MED ORDER — NALOXONE HCL 0.4 MG/ML IJ SOLN: 0.4000 mg | INTRAMUSCULAR | Status: DC | PRN

## 2021-07-17 MED ORDER — OXYCODONE HCL 5 MG/5ML PO SOLN: 5.0000 mg | Freq: Once | ORAL | Status: DC | PRN

## 2021-07-17 MED ORDER — SENNOSIDES-DOCUSATE SODIUM 8.6-50 MG PO TABS
2.0000 | ORAL_TABLET | Freq: Every day | ORAL | Status: DC
  Administered 2021-07-18 – 2021-07-20 (×3): 2 via ORAL
  Filled 2021-07-17 (×3): qty 2

## 2021-07-17 MED ORDER — NALBUPHINE HCL 10 MG/ML IJ SOLN: 5.0000 mg | Freq: Once | INTRAMUSCULAR | Status: DC | PRN

## 2021-07-17 MED ORDER — FENTANYL CITRATE (PF) 100 MCG/2ML IJ SOLN
INTRAMUSCULAR | Status: DC | PRN
  Administered 2021-07-17: 15 ug via INTRATHECAL

## 2021-07-17 MED ORDER — MORPHINE SULFATE (PF) 0.5 MG/ML IJ SOLN
INTRAMUSCULAR | Status: AC
  Filled 2021-07-17: qty 10

## 2021-07-17 MED ORDER — KETOROLAC TROMETHAMINE 30 MG/ML IJ SOLN
30.0000 mg | Freq: Once | INTRAMUSCULAR | Status: AC | PRN
  Administered 2021-07-17: 30 mg via INTRAVENOUS

## 2021-07-17 MED ORDER — HYDRALAZINE HCL 20 MG/ML IJ SOLN: 10.0000 mg | INTRAMUSCULAR | Status: DC | PRN

## 2021-07-17 MED ORDER — PHENYLEPHRINE HCL-NACL 20-0.9 MG/250ML-% IV SOLN
INTRAVENOUS | Status: DC | PRN
  Administered 2021-07-17: 20 ug/min via INTRAVENOUS

## 2021-07-17 MED ORDER — IBUPROFEN 600 MG PO TABS
600.0000 mg | ORAL_TABLET | Freq: Four times a day (QID) | ORAL | Status: DC
  Administered 2021-07-18 – 2021-07-20 (×7): 600 mg via ORAL
  Filled 2021-07-17 (×7): qty 1

## 2021-07-17 MED ORDER — SIMETHICONE 80 MG PO CHEW: 80.0000 mg | CHEWABLE_TABLET | ORAL | Status: DC | PRN

## 2021-07-17 MED ORDER — LABETALOL HCL 5 MG/ML IV SOLN: 80.0000 mg | INTRAVENOUS | Status: DC | PRN

## 2021-07-17 MED ORDER — NALOXONE HCL 4 MG/10ML IJ SOLN
1.0000 ug/kg/h | INTRAVENOUS | Status: DC | PRN
  Filled 2021-07-17: qty 5

## 2021-07-17 MED ORDER — SODIUM CHLORIDE 0.9 % IR SOLN
Status: DC | PRN
  Administered 2021-07-17: 1000 mL

## 2021-07-17 MED ORDER — MAGNESIUM SULFATE 40 GM/1000ML IV SOLN
2.0000 g/h | INTRAVENOUS | Status: DC
  Administered 2021-07-17: 4 g/h via INTRAVENOUS

## 2021-07-17 MED ORDER — PHENYLEPHRINE HCL-NACL 20-0.9 MG/250ML-% IV SOLN
INTRAVENOUS | Status: AC
  Filled 2021-07-17: qty 250

## 2021-07-17 MED ORDER — DIBUCAINE (PERIANAL) 1 % EX OINT: 1.0000 "application " | TOPICAL_OINTMENT | CUTANEOUS | Status: DC | PRN

## 2021-07-17 MED ORDER — OXYCODONE-ACETAMINOPHEN 5-325 MG PO TABS: 1.0000 | ORAL_TABLET | ORAL | Status: DC | PRN

## 2021-07-17 MED ORDER — SCOPOLAMINE 1 MG/3DAYS TD PT72: 1.0000 | MEDICATED_PATCH | Freq: Once | TRANSDERMAL | Status: DC

## 2021-07-17 MED ORDER — MAGNESIUM SULFATE 40 GM/1000ML IV SOLN
2.0000 g/h | INTRAVENOUS | Status: AC
  Administered 2021-07-18: 2 g/h via INTRAVENOUS
  Filled 2021-07-17: qty 1000

## 2021-07-17 MED ORDER — MAGNESIUM SULFATE BOLUS VIA INFUSION: 4.0000 g | Freq: Once | INTRAVENOUS | Status: DC

## 2021-07-17 MED ORDER — FENTANYL CITRATE (PF) 100 MCG/2ML IJ SOLN: INTRAMUSCULAR | Status: DC | PRN

## 2021-07-17 MED ORDER — LABETALOL HCL 5 MG/ML IV SOLN: 20.0000 mg | INTRAVENOUS | Status: DC | PRN

## 2021-07-17 MED ORDER — LACTATED RINGERS IV SOLN: INTRAVENOUS | Status: DC | PRN

## 2021-07-17 MED ORDER — LABETALOL HCL 5 MG/ML IV SOLN
20.0000 mg | INTRAVENOUS | Status: DC | PRN
  Administered 2021-07-17: 20 mg via INTRAVENOUS
  Filled 2021-07-17: qty 4

## 2021-07-17 MED ORDER — STERILE WATER FOR IRRIGATION IR SOLN
Status: DC | PRN
  Administered 2021-07-17: 1000 mL

## 2021-07-17 MED ORDER — LIDOCAINE HCL (PF) 1 % IJ SOLN: 30.0000 mL | INTRAMUSCULAR | Status: DC | PRN

## 2021-07-17 MED ORDER — COCONUT OIL OIL: 1.0000 "application " | TOPICAL_OIL | Status: DC | PRN

## 2021-07-17 MED ORDER — BETAMETHASONE SOD PHOS & ACET 6 (3-3) MG/ML IJ SUSP
12.0000 mg | Freq: Once | INTRAMUSCULAR | Status: AC
  Administered 2021-07-17: 12 mg via INTRAMUSCULAR
  Filled 2021-07-17: qty 5

## 2021-07-17 MED ORDER — OXYTOCIN BOLUS FROM INFUSION: 333.0000 mL | Freq: Once | INTRAVENOUS | Status: DC

## 2021-07-17 MED ORDER — KETOROLAC TROMETHAMINE 30 MG/ML IJ SOLN
30.0000 mg | Freq: Four times a day (QID) | INTRAMUSCULAR | Status: AC
  Administered 2021-07-17 – 2021-07-18 (×3): 30 mg via INTRAVENOUS
  Filled 2021-07-17 (×3): qty 1

## 2021-07-17 MED ORDER — DIPHENHYDRAMINE HCL 50 MG/ML IJ SOLN: 12.5000 mg | INTRAMUSCULAR | Status: DC | PRN

## 2021-07-17 MED ORDER — ONDANSETRON HCL 4 MG/2ML IJ SOLN
INTRAMUSCULAR | Status: DC | PRN
  Administered 2021-07-17: 4 mg via INTRAVENOUS

## 2021-07-17 MED ORDER — ONDANSETRON HCL 4 MG/2ML IJ SOLN: 4.0000 mg | Freq: Once | INTRAMUSCULAR | Status: DC | PRN

## 2021-07-17 MED ORDER — HYDROMORPHONE HCL 1 MG/ML IJ SOLN
0.2500 mg | INTRAMUSCULAR | Status: DC | PRN
Start: 2021-07-17 — End: 2021-07-17

## 2021-07-17 MED ORDER — OXYCODONE HCL 5 MG PO TABS
5.0000 mg | ORAL_TABLET | ORAL | Status: DC | PRN
  Administered 2021-07-18 (×3): 5 mg via ORAL
  Administered 2021-07-19: 10 mg via ORAL
  Filled 2021-07-17 (×2): qty 1
  Filled 2021-07-17: qty 2
  Filled 2021-07-17: qty 1

## 2021-07-17 SURGICAL SUPPLY — 32 items
BENZOIN TINCTURE PRP APPL 2/3 (GAUZE/BANDAGES/DRESSINGS) ×2 IMPLANT
CLOSURE STERI STRIP 1/2 X4 (GAUZE/BANDAGES/DRESSINGS) ×2 IMPLANT
CLOTH BEACON ORANGE TIMEOUT ST (SAFETY) ×2 IMPLANT
DRSG OPSITE POSTOP 4X10 (GAUZE/BANDAGES/DRESSINGS) ×2 IMPLANT
ELECT REM PT RETURN 9FT ADLT (ELECTROSURGICAL) ×2
ELECTRODE REM PT RTRN 9FT ADLT (ELECTROSURGICAL) ×1 IMPLANT
EXTRACTOR VACUUM KIWI (MISCELLANEOUS) IMPLANT
GLOVE BIOGEL PI IND STRL 7.0 (GLOVE) ×1 IMPLANT
GLOVE BIOGEL PI INDICATOR 7.0 (GLOVE) ×1
GLOVE SURG ORTHO 8.0 STRL STRW (GLOVE) ×2 IMPLANT
GOWN STRL REUS W/TWL LRG LVL3 (GOWN DISPOSABLE) ×4 IMPLANT
KIT ABG SYR 3ML LUER SLIP (SYRINGE) IMPLANT
NEEDLE HYPO 25X5/8 SAFETYGLIDE (NEEDLE) IMPLANT
NS IRRIG 1000ML POUR BTL (IV SOLUTION) ×2 IMPLANT
PACK C SECTION WH (CUSTOM PROCEDURE TRAY) ×2 IMPLANT
PAD ABD 7.5X8 STRL (GAUZE/BANDAGES/DRESSINGS) ×2 IMPLANT
PAD OB MATERNITY 4.3X12.25 (PERSONAL CARE ITEMS) ×2 IMPLANT
PENCIL SMOKE EVAC W/HOLSTER (ELECTROSURGICAL) ×2 IMPLANT
RTRCTR C-SECT PINK 25CM LRG (MISCELLANEOUS) IMPLANT
SPONGE GAUZE 4X4 12PLY STER LF (GAUZE/BANDAGES/DRESSINGS) ×4 IMPLANT
STRIP CLOSURE SKIN 1/2X4 (GAUZE/BANDAGES/DRESSINGS) IMPLANT
SUT MON AB-0 CT1 36 (SUTURE) ×4 IMPLANT
SUT PLAIN 0 NONE (SUTURE) IMPLANT
SUT VIC AB 0 CT1 27 (SUTURE) ×2
SUT VIC AB 0 CT1 27XBRD ANBCTR (SUTURE) ×2 IMPLANT
SUT VIC AB 2-0 CT1 27 (SUTURE) ×1
SUT VIC AB 2-0 CT1 TAPERPNT 27 (SUTURE) ×1 IMPLANT
SUT VIC AB 4-0 SH 27 (SUTURE) ×1
SUT VIC AB 4-0 SH 27XANBCTRL (SUTURE) ×1 IMPLANT
TOWEL OR 17X24 6PK STRL BLUE (TOWEL DISPOSABLE) ×2 IMPLANT
TRAY FOLEY W/BAG SLVR 14FR LF (SET/KITS/TRAYS/PACK) ×2 IMPLANT
WATER STERILE IRR 1000ML POUR (IV SOLUTION) ×2 IMPLANT

## 2021-07-17 NOTE — Anesthesia Postprocedure Evaluation (Signed)
Anesthesia Post Note  Patient: Paramedic  Procedure(s) Performed: CESAREAN SECTION     Patient location during evaluation: Mother Baby Anesthesia Type: Spinal Level of consciousness: oriented and awake and alert Pain management: pain level controlled Vital Signs Assessment: post-procedure vital signs reviewed and stable Respiratory status: spontaneous breathing and respiratory function stable Cardiovascular status: blood pressure returned to baseline and stable Postop Assessment: no headache, no backache, no apparent nausea or vomiting and able to ambulate Anesthetic complications: no   No notable events documented.  Last Vitals:  Vitals:   07/17/21 1700 07/17/21 1717  BP: 117/77 133/89  Pulse: 66 64  Resp: 20 19  Temp: 36.4 C 36.6 C  SpO2: 100% 100%    Last Pain:  Vitals:   07/17/21 1717  TempSrc: Oral   Pain Goal:                   Trevor Iha

## 2021-07-17 NOTE — Discharge Summary (Shared)
Postpartum Discharge Summary  Date of Service updated***     Patient Name: Stephanie [name] DOB: Sep 21, 1995 MRN: [id number]  Date of admission: 07/17/2021 Delivery date:07/17/2021  Delivering provider: Woodroe Mode  Date of discharge: 07/17/2021  Admitting diagnosis: Pregnancy [Z34.90] Intrauterine pregnancy: [redacted]w[redacted]d    Secondary diagnosis:  Principal Problem:   Cesarean delivery delivered Active Problems:   Fetal abnormality in antepartum pregnancy   Obesity in pregnancy, antepartum   Fetal growth restriction antepartum   Severe pre-eclampsia  Additional problems: ***    Discharge diagnosis: Preterm Pregnancy Delivered                                              Post partum procedures:{Postpartum procedures:23558} Augmentation: N/A Complications: None  Hospital course: Sceduled C/S 25y.o. yo G2P0111 at 389w0das admitted to the hospital 07/17/2021 for severe pre-eclampsia and NRFHT. Upon arrival to L&D, patient had minimal to absent variability with intermittent late decelerations. Patient subsequently went for primary cesarean section due to NRFHT.  Membrane Rupture Time/Date: 3:07 PM ,07/17/2021   Delivery Method:C-Section, Low Transverse  Details of operation can be found in separate operative note.  Patient had an uncomplicated postpartum course.  She is ambulating, tolerating a regular diet, passing flatus, and urinating well. Patient is discharged home in stable condition on  07/17/21        Newborn Data: Birth date:07/17/2021  Birth time:3:08 PM  Gender:Female  Living status:Living  Apgars: ,  Weight:1530 g     Magnesium Sulfate received: Yes: Seizure prophylaxis BMZ received: Yes x1 Rhophylac: N/A MMR: N/A - Immune  T-DaP: Given prenatally Flu: Given prenatally  Transfusion: No ***  Physical exam  Vitals:   07/17/21 1630 07/17/21 1645 07/17/21 1700 07/17/21 1717  BP: 124/71 (!) 145/75 117/77 133/89  Pulse: (!) 59 60 66 64  Resp: (!) 25 16 20 19    Temp:   97.6 F (36.4 C) 97.8 F (36.6 C)  TempSrc:   Oral Oral  SpO2: 98% 98% 100% 100%   General: {Exam; general:21111117} Lochia: {Desc; appropriate/inappropriate:30686::"appropriate"} Uterine Fundus: {Desc; firm/soft:30687} Incision: {Exam; incision:21111123} DVT Evaluation: {Exam; dvt:2111122}  Labs: Lab Results  Component Value Date   WBC 12.1 (H) 07/17/2021   HGB 13.6 07/17/2021   HCT 42.0 07/17/2021   MCV 83.7 07/17/2021   PLT 414 (H) 07/17/2021   CMP Latest Ref Rng & Units 07/17/2021  Glucose 70 - 99 mg/dL 82  BUN 6 - 20 mg/dL 9  Creatinine 0.44 - 1.00 mg/dL 0.85  Sodium 135 - 145 mmol/L 137  Potassium 3.5 - 5.1 mmol/L 4.2  Chloride 98 - 111 mmol/L 109  CO2 22 - 32 mmol/L 18(L)  Calcium 8.9 - 10.3 mg/dL 8.1(L)  Total Protein 6.5 - 8.1 g/dL 5.6(L)  Total Bilirubin 0.3 - 1.2 mg/dL 0.7  Alkaline Phos 38 - 126 U/L 142(H)  AST 15 - 41 U/L 16  ALT 0 - 44 U/L 11   Edinburgh Score: No flowsheet data found.   After visit meds:  Allergies as of 07/17/2021   No Known Allergies   Med Rec must be completed [facility]       Discharge home in stable condition Infant Feeding: {Baby feeding:23562} Infant Disposition:{CHL IP OB HOME WITH MOWLSLHT:34287}ischarge instruction: per After Visit Summary and Postpartum booklet. Activity: Advance as tolerated. Pelvic rest  for 6 weeks.  Diet: {OB OMVE:72094709} Future Appointments: Future Appointments  Date Time Provider [place]  07/24/2021  8:30 AM [facility] NURSE [facility]  07/24/2021  8:45 AM [facility] US5 [facility]  07/24/2021  9:45 AM [facility] NST [facility]  07/31/2021 12:45 PM [facility] NURSE [facility]  07/31/2021  1:00 PM [facility] US1 [facility]  07/31/2021  2:15 PM [facility] NST [facility] [place]   Follow up Visit: Message sent to [facility] by Dr. Gwenlyn Perking on 07/17/21.   Please schedule this patient for a In person postpartum visit in  4-6 weeks  with the following provider: Any  provider. Additional Postpartum F/U: Incision check 1 week and BP check 1 week  High risk pregnancy complicated by:  Severe pre-eclampsia and FGR Delivery mode:  C-Section, Low Transverse  Anticipated Birth Control:  Condoms   07/17/2021 Genia Del, MD

## 2021-07-17 NOTE — Procedures (Signed)
Stephanie [name] 08-09-1996 [redacted]w[redacted]d  Fetus A Non-Stress Test Interpretation for 07/17/21  Indication: IUGR  Fetal Heart Rate A Mode: External Baseline Rate (A): 145 bpm Variability: Absent Accelerations: None Decelerations: Variable (FHR decreased to 120's-130's x 50 sec with return to baseline.) Multiple birth?: No  Uterine Activity Mode: Palpation, Toco Contraction Frequency (min): none Resting Tone Palpated: Relaxed  Interpretation (Fetal Testing) Nonstress Test Interpretation: Non-reactive Overall Impression: Non-reassuring Comments: Dr. Judeth Cornfield reviewed tracing. NST not completed due to FHR tracing and urgency of patient being transferred to MAU. Patient sent to [facility] MAU for futher monitoring and evaluation for possible delivery.

## 2021-07-17 NOTE — Anesthesia Procedure Notes (Addendum)
Spinal  Patient location during procedure: OB Start time: 07/17/2021 2:45 PM End time: 07/17/2021 2:51 PM Reason for block: surgical anesthesia Staffing Performed: anesthesiologist  Anesthesiologist: Trevor Iha, MD Preanesthetic Checklist Completed: patient identified, IV checked, risks and benefits discussed, surgical consent, monitors and equipment checked, pre-op evaluation and timeout performed Spinal Block Patient position: sitting Prep: DuraPrep and site prepped and draped Patient monitoring: heart rate, cardiac monitor, continuous pulse ox and blood pressure Approach: midline Location: L3-4 Injection technique: single-shot Needle Needle type: Pencan  Needle gauge: 24 G Needle length: 10 cm Needle insertion depth: 8 cm Assessment Sensory level: T4 Events: CSF return Additional Notes 2 Attempt (s) at L3-4 and 1 attempt at L2-3 . Pt tolerated procedure well.

## 2021-07-17 NOTE — H&P (Signed)
OBSTETRIC ADMISSION HISTORY AND PHYSICAL  Stephanie [name] is a 25 y.o. female G2P0010 with IUP at [redacted]w[redacted]d by LMP presenting for decreased fetal movement, BPP 6/8, and NRFHT. She reports no contractions, vaginal bleeding, headaches, vision changes, or RUQ pain. She has had some LE swelling.  She plans on breast feeding. She requests condoms for birth control postpartum.  She received her prenatal care at [facility].  Dating: By LMP --->  Estimated Date of Delivery: 08/28/21  Sono:   @[redacted]w[redacted]d , CWD, right pelvic kidney otherwise normal anatomy, cephalic presentation, posterior placental lie, 1409 g, 1.4% EFW  Prenatal History/Complications:  IUGR Obesity  Pre-eclampsia (now severe)  Past Medical History: Past Medical History:  Diagnosis Date   Medical history non-contributory     Past Surgical History: Past Surgical History:  Procedure Laterality Date   WISDOM TOOTH EXTRACTION Bilateral     Obstetrical History: OB History     Gravida  2   Para  0   Term  0   Preterm  0   AB  1   Living  0      SAB  1   IAB  0   Ectopic  0   Multiple  0   Live Births  0           Social History Social History   Socioeconomic History   Marital status: Single    Spouse name: Not on file   Number of children: Not on file   Years of education: Not on file   Highest education level: Not on file  Occupational History   Not on file  Tobacco Use   Smoking status: Never   Smokeless tobacco: Never  Vaping Use   Vaping Use: Never used  Substance and Sexual Activity   Alcohol use: Not Currently   Drug use: Never   Sexual activity: Not Currently    Partners: Male    Birth control/protection: None  Other Topics Concern   Not on file  Social History Narrative   Not on file   Social Determinants of Health   Financial Resource Strain: Not on file  Food Insecurity: Not on file  Transportation Needs: Not on file  Physical Activity: Not on file  Stress: Not on file   Social Connections: Not on file    Family History: Family History  Problem Relation Age of Onset   Asthma Brother    Diabetes Maternal Grandmother    Migraines Mother     Allergies: No Known Allergies  Medications Prior to Admission  Medication Sig Dispense Refill Last Dose   acetaminophen (TYLENOL) 500 MG tablet Take 500 mg by mouth every 6 (six) hours as needed.      calcium carbonate (TUMS - DOSED IN MG ELEMENTAL CALCIUM) 500 MG chewable tablet Chew 1 tablet by mouth daily.      Prenatal Vit-Fe Fumarate-FA (PRENATAL MULTIVITAMIN) TABS tablet Take 1 tablet by mouth daily at 12 noon.        Review of Systems  All systems reviewed and negative except as stated in HPI  Blood pressure (!) 160/98, pulse 64, temperature 98.2 F (36.8 C), resp. rate (!) 24, last menstrual period 11/21/2020, SpO2 100 %, unknown if currently breastfeeding.  General appearance: alert, cooperative, and no distress Lungs: normal work of breathing on room air  Heart: normal rate, warm and well perfused  Abdomen: soft, non-tender, gravid  Extremities: LE edema present  Fetal monitoring: Baseline 115 bpm, minimal to absent variability, no accels, intermittent  late decels  Uterine activity: No contractions  Prenatal labs: ABO, Rh: O/Positive/-- (06/10 1050) Antibody: Negative (06/10 1050) Rubella: 1.95 (06/10 1050) RPR: Non Reactive (10/26 0854)  HBsAg: Negative (06/10 1050)  HIV: Non Reactive (10/26 0854)  GBS:  Unknown 2 hr Glucola normal  Genetic screening - LR NIPS, AFP neg, Horizon neg Anatomy US: Right pelvic kidney, otherwise normal anatomy  Prenatal Transfer Tool  Maternal Diabetes: No Genetic Screening: Normal Maternal Ultrasounds/Referrals: IUGR, right pelvic kidney Fetal Ultrasounds or other Referrals:  Referred to Materal Fetal Medicine  Maternal Substance Abuse:  No Significant Maternal Medications:  None Significant Maternal Lab Results: None  No results found for this or  any previous visit (from the past 24 hour(s)).  Patient Active Problem List   Diagnosis Date Noted   Pregnancy 07/17/2021   Fetal growth restriction antepartum 07/03/2021   Preeclampsia 07/03/2021   Obesity in pregnancy, antepartum 06/12/2021   Fetal abnormality in antepartum pregnancy 04/04/2021   Supervision of other normal pregnancy, antepartum 01/17/2021   Learning problem 12/31/2013    Assessment/Plan:  Stephanie [name] is a 5 y.o. G2P0010 at [redacted]w[redacted]d here for NRFHT, BPP 6/8, and now pre-eclampsia with severe features.   Patient was sent to MAU from MFM office due to late decelerations and BPP 6/8 at her appointment today associated with decreased fetal movement. Upon arrival, patient again had late decelerations with minimal to absent variability. Patient also had severe range pressures, meeting criteria for severe pre-eclampsia.   Given that patient was remote from delivery with non-reassuring fetal status, cesarean delivery was recommended.   The risks of surgery were discussed with the patient including but were not limited to: bleeding which may require transfusion or reoperation; infection which may require antibiotics; injury to bowel, bladder, ureters or other surrounding organs; injury to the fetus; need for additional procedures including hysterectomy in the event of a life-threatening hemorrhage; formation of adhesions; placental abnormalities with subsequent pregnancies; incisional problems; thromboembolic phenomenon and other postoperative/anesthesia complications.  The patient concurred with the proposed plan, giving informed written consent for the procedure.   Patient has been NPO since 10:30 AM and she will remain NPO for procedure. Anesthesia and OR aware. Preoperative prophylactic antibiotics and SCDs ordered on call to the OR.  To OR when ready.  #Pain: Spinal per anesthesia #FWB: Cat 2-3 due to intermittent absent variability #ID:  Ancef for pre-operative  antibiotics #MOF: Breast #MOC: Condoms   #Severe pre-eclampsia: Treated with IV Labetalol in MAU. Magnesium ordered with bolus followed by infusion. Pre-E labs pending, will follow up results. Will continue to monitor BP closely. Continue magnesium for 24 hours post-delivery.  Worthy Rancher, MD  07/17/2021, 2:21 PM

## 2021-07-17 NOTE — Op Note (Signed)
Stephanie [name]  PROCEDURE DATE: 07/17/2021  PREOPERATIVE DIAGNOSES: Intrauterine pregnancy at [redacted]w[redacted]d weeks gestation; non-reassuring fetal status; severe pre-eclampsia  POSTOPERATIVE DIAGNOSES: The same  PROCEDURE: Primary Low Transverse Cesarean Section  SURGEON:  Dr. Scheryl Darter  ASSISTANT:  Dr. Evalina Field  ANESTHESIOLOGY TEAM: Anesthesiologist: Trevor Iha, MD CRNA: Renford Dills, CRNA; Rhymer, Doree Fudge, CRNA  INDICATIONS: Stephanie [name] is a 25 y.o. [id number] at [redacted]w[redacted]d here for cesarean section secondary to the indications listed under preoperative diagnoses; please see preoperative note for further details.  The risks of cesarean section were discussed with the patient including but were not limited to: bleeding which may require transfusion or reoperation; infection which may require antibiotics; injury to bowel, bladder, ureters or other surrounding organs; injury to the fetus; need for additional procedures including hysterectomy in the event of a life-threatening hemorrhage; placental abnormalities wth subsequent pregnancies, incisional problems, thromboembolic phenomenon and other postoperative/anesthesia complications.   The patient concurred with the proposed plan, giving informed written consent for the procedure.    FINDINGS:  Viable female infant in cephalic presentation.  Apgars pending.  Clear amniotic fluid.  Intact placenta, three vessel cord.  Normal uterus.  ANESTHESIA: Spinal  INTRAVENOUS FLUIDS: 1300 ml ESTIMATED BLOOD LOSS: 267 ml URINE OUTPUT:  125 ml SPECIMENS: Placenta sent to pathology COMPLICATIONS: None immediate  PROCEDURE IN DETAIL:  The patient preoperatively received intravenous antibiotics and had sequential compression devices applied to her lower extremities.  She was then taken to the operating room where spinal anesthesia was administered and was found to be adequate. She was then placed in a dorsal supine position with a leftward tilt,  and prepped and draped in a sterile manner.  A foley catheter was placed into her bladder and attached to constant gravity.    After an adequate timeout was performed, a Pfannenstiel skin incision was made with scalpel and carried through to the underlying layer of fascia. The fascia was incised in the midline, and this incision was extended bilaterally bluntly.  Kocher clamps were applied to the superior aspect of the fascial incision and the underlying rectus muscles were dissected off bluntly and sharply.  A similar process was carried out on the inferior aspect of the fascial incision. The rectus muscles were separated in the midline and the peritoneum was entered bluntly. The Alexis self-retaining retractor was introduced into the abdominal cavity.    Attention was turned to the lower uterine segment where a low transverse hysterotomy was made with a scalpel and extended bilaterally bluntly.  The infant was successfully delivered, the cord was clamped and cut after one minute, and the infant was handed over to the awaiting neonatology team. Uterine massage was then administered, and the placenta delivered intact with a three-vessel cord. The uterus was then cleared of clots and debris.    The hysterotomy was closed with 0 Vicryl in a running locked fashion, and an imbricating layer was also placed with 0 Vicryl.  The pelvis was cleared of all clot and debris. Hemostasis was confirmed on all surfaces.  The retractor was removed.    The peritoneum was closed with a 2-0 Vicryl running stitch. The fascia was then closed using 0 Vicryl in a running fashion.  The subcutaneous layer was irrigated, re-approximated with a 2-0 plain gut running stitch, and the skin was closed with a 4-0 Vicryl subcuticular stitch. The patient tolerated the procedure well. Sponge, instrument and needle counts were correct x 3.  She was taken to the recovery  room in stable condition.   Evalina Field, MD OB Fellow  Faculty  Practice

## 2021-07-17 NOTE — MAU Note (Signed)
Pt reports she was seen in MFM this morning and was told to come to MAU for further evaluation.   Pt reports decreased fetal movement for the last 3 days.   Denies vaginal bleeding or LOF

## 2021-07-17 NOTE — Anesthesia Preprocedure Evaluation (Signed)
Anesthesia Evaluation   Patient awake    Reviewed: Allergy & Precautions, NPO status Preop documentation limited or incomplete due to emergent nature of procedure.  Airway Mallampati: II  TM Distance: >3 FB Neck ROM: Full    Dental no notable dental hx. (+) Teeth Intact, Dental Advisory Given   Pulmonary    Pulmonary exam normal breath sounds clear to auscultation       Cardiovascular hypertension (Pre E), Pt. on medications Normal cardiovascular exam Rhythm:Regular Rate:Normal     Neuro/Psych    GI/Hepatic negative GI ROS,   Endo/Other  Morbid obesity (BMI 41.64)  Renal/GU negative Renal ROS     Musculoskeletal   Abdominal   Peds  Hematology Lab Results      Component                Value               Date                      WBC                      12.1 (H)            07/17/2021                HGB                      13.6                07/17/2021                HCT                      42.0                07/17/2021                MCV                      83.7                07/17/2021                PLT                      414 (H)             07/17/2021              Anesthesia Other Findings NKA  Reproductive/Obstetrics (+) Pregnancy                             Anesthesia Physical Anesthesia Plan  ASA: 3 and emergent  Anesthesia Plan: Spinal   Post-op Pain Management:    Induction:   PONV Risk Score and Plan: Treatment may vary due to age or medical condition and Ondansetron  Airway Management Planned: Natural Airway and Nasal Cannula  Additional Equipment: None  Intra-op Plan:   Post-operative Plan:   Informed Consent: I have reviewed the patients History and Physical, chart, labs and discussed the procedure including the risks, benefits and alternatives for the proposed anesthesia with the patient or authorized representative who has indicated his/her  understanding and acceptance.     Dental advisory given  Plan Discussed with: CRNA and Anesthesiologist  Anesthesia Plan  Comments: (34 wk primagravida w NRFHTs)        Anesthesia Quick Evaluation

## 2021-07-17 NOTE — Transfer of Care (Signed)
Immediate Anesthesia Transfer of Care Note  Patient: Paramedic  Procedure(s) Performed: CESAREAN SECTION  Patient Location: PACU  Anesthesia Type:Spinal  Level of Consciousness: awake  Airway & Oxygen Therapy: Patient Spontanous Breathing  Post-op Assessment: Report given to RN and Post -op Vital signs reviewed and stable  Post vital signs: Reviewed and stable  Last Vitals:  Vitals Value Taken Time  BP 115/89 07/17/21 1601  Temp    Pulse 58 07/17/21 1603  Resp 25 07/17/21 1603  SpO2 99 % 07/17/21 1603  Vitals shown include unvalidated device data.  Last Pain: There were no vitals filed for this visit.       Complications: No notable events documented.

## 2021-07-17 NOTE — MAU Provider Note (Signed)
History     CSN: [id number]  Arrival date and time: 07/17/21 1340   Event Date/Time   First Provider Initiated Contact with Patient 07/17/21 1352      Chief Complaint  Patient presents with   Decreased Fetal Movement   HPI Stephanie [name] is a 25 y.o. G2P0010 at [redacted]w[redacted]d who presents to MAU from MFM for evaluation of late decels. Her pregnancy is complicated by Preeclampsia and FGR. On arrival to MAU she endorses decreased fetal movement "just occasional flutters for the past 3-4 days. She denies headache, visual disturbances, RUQ/epigastric pain, new onset swelling or weight gain.  She receives care with [employer].   OB History     Gravida  2   Para  0   Term  0   Preterm  0   AB  1   Living  0      SAB  1   IAB  0   Ectopic  0   Multiple  0   Live Births  0           Past Medical History:  Diagnosis Date   Medical history non-contributory     Past Surgical History:  Procedure Laterality Date   WISDOM TOOTH EXTRACTION Bilateral     Family History  Problem Relation Age of Onset   Asthma Brother    Diabetes Maternal Grandmother    Migraines Mother     Social History   Tobacco Use   Smoking status: Never   Smokeless tobacco: Never  Vaping Use   Vaping Use: Never used  Substance Use Topics   Alcohol use: Not Currently   Drug use: Never    Allergies: No Known Allergies  Medications Prior to Admission  Medication Sig Dispense Refill Last Dose   acetaminophen (TYLENOL) 500 MG tablet Take 500 mg by mouth every 6 (six) hours as needed.      calcium carbonate (TUMS - DOSED IN MG ELEMENTAL CALCIUM) 500 MG chewable tablet Chew 1 tablet by mouth daily.      Prenatal Vit-Fe Fumarate-FA (PRENATAL MULTIVITAMIN) TABS tablet Take 1 tablet by mouth daily at 12 noon.       Review of Systems  Eyes:  Negative for visual disturbance.  Gastrointestinal:  Negative for abdominal pain.  Musculoskeletal:  Negative for back pain.  Neurological:   Negative for headaches.  All other systems reviewed and are negative. Physical Exam   Blood pressure (!) 160/98, pulse 64, temperature 98.2 F (36.8 C), resp. rate (!) 24, last menstrual period 11/21/2020, SpO2 100 %, unknown if currently breastfeeding.  Physical Exam Vitals and nursing note reviewed. Exam conducted with a chaperone present.  Constitutional:      Appearance: She is ill-appearing.  Eyes:     Comments: Bilateral periorbital edema  Cardiovascular:     Rate and Rhythm: Normal rate.     Pulses: Normal pulses.  Pulmonary:     Effort: Pulmonary effort is normal.     Breath sounds: Normal breath sounds.  Abdominal:     Comments: Gravid  Neurological:     Mental Status: She is alert.    MAU Course/MDM  Procedures  --Report received from Dr. Elgie Congo prior to arrival.  --Severe range BP, minimal variability, late deceleration on arrival to MAU. Dr. Elgie Congo notified. Will admit to L&D. NICU aware of patient inbound to MAU, called by CNM and notified of plan for admission, poor fetal status.  --Report called to Dr. Roselie Awkward. Concern for recurrent late decels,  minimal variability, gradually decreasing baseline --Dr. Debroah Loop on unit and in room, tracing reviewed, per Dr. Debroah Loop patient is appropriate for transfer to L&D  Patient Vitals for the past 24 hrs:  BP Temp Pulse Resp SpO2  07/17/21 1419 (!) 151/87 -- 70 -- 100 %  07/17/21 1348 (!) 160/98 -- 64 -- --  07/17/21 1347 -- 98.2 F (36.8 C) -- (!) 24 100 %   Orders Placed This Encounter  Procedures   Resp Panel by RT-PCR (Flu A&B, Covid) Nasopharyngeal Swab   CBC   RPR   Comprehensive metabolic panel   Protein / creatinine ratio, urine   Diet NPO time specified   Notify physician (specify) Confirmatory reading of BP> 160/110 15 minutes later   Measure blood pressure   Vital signs   Notify physician (specify)   Activity as tolerated   Fetal monitoring per unit policy   Cervical Exam   Measure blood pressure post  delivery every 15 min x 1 hour then every 30 min x 1 hour   Fundal check post delivery every 15 min x 1 hour then every 30 min x 1 hour   If Rapid HIV test positive or known HIV positive: initiate AZT orders   May in and out cath x 2 for inability to void   Insert urethral catheter X 1 PRN If Coude Catheter is chosen, qualified resources by campus can be found in the clinical skills nursing procedure for Coude Catheter 1. If straight catheterized > 2 times or patient unable to void post epidural plac...   Refer to Sidebar Report Urinary (Foley) Catheter Indications   Refer to Sidebar Report Post Indwelling Urinary Catheter Removal and Intervention Guidelines   Discontinue foley prior to vaginal delivery   Informed Consent Details: Physician/Practitioner Attestation; Transcribe to consent form and obtain patient signature   Initiate Pre-op Protocol   Clip operative site   Patient has an active order for admit to inpatient/place in observation   Full code   Type and screen [facility]   Insert peripheral IV   Insert and maintain IV Line   Admit to Inpatient (patient's expected length of stay will be greater than 2 midnights or inpatient only procedure)     Assessment and Plan  --25 y.o. G2P0010 at [redacted]w[redacted]d  --Non-reassuring fetal status --Severe Preeclampsia s/p Labetalol x 1 per MAU policy --Per Dr. Donavan Foil and Dr. Debroah Loop, admit to L&D   Calvert Cantor, CNM 07/17/2021, 2:23 PM

## 2021-07-17 NOTE — Progress Notes (Addendum)
Maternal-Fetal Medicine   Name: Stephanie [name] DOB: 11/02/1995 MRN: [id number] Referring Provider: Mariel Aloe, MD  Ms. Hinely, G1 P0 at 34 weeks' gestation with preeclampsia returned for antenatal testing. Severe fetal growth restriction was diagnosed 2 weeks ago and the estimated fetal weight was at the 1st percentile. Patient had labs drawn at the MAU then and platelets, liver enzymes and creatinine were within normal range.Of note, right fetal pelvic kidney was seen at previous ultrasound.  Patient complained of decreased fetal movements for about 3 days.  Ultrasound On today's ultrasound, amniotic fluid is normal but decreased fetal activity was seen.  Fetal movements did not meet the criteria of BPP.  Umbilical artery Doppler showed persistent reversed end-diastolic flow. BPP 6/10 (NST is not reactive, but incomplete; less than 20 minutes).  I counseled the patient on the finding of abnormal umbilical artery Doppler study.  Reversed end-diastolic flow is associated with increased risk of perinatal mortality and morbidity.  Given that patient had decreased fetal movements I recommended going to the hospital for delivery.  I discussed the possibility of antenatal corticosteroids if NST is reactive at the hospital. I informed the patient that cesarean delivery is more-likely mode of delivery.  I discussed with Dr. Donavan Foil, [facility] attending and learned that our NICU is full and will not be able to accept transfers.  I called [facility] and spoke with [facility] (Dr. Ardean Larsen) who recommended I call the transfer line.  I called the transfer line and was informed that they will call back in 15 minutes after discussing with the NICU and [facility]. We decided to perform NST well the phone calls were being made.  No variability was seen and 1 deceleration down to 120 bpm was seen (see NST report). We could not complete the NST. Because of  the urgency of her clinical condition, I discussed with Dr. Donavan Foil who decided to accept the patient at the MAU.   I counseled the couple on the bed situation and the likelihood of delivery at [facility] [place], and possible transfer of the newborn to another hospital. The couple agreed with my recommendations and proceeded to the MAU.  Recommendations -I recommend delivery today WITHOUT antenatal corticosteroids. -  Thank you for consultation.  If you have any questions or concerns, please contact me the [facility].  Consultation including face-to-face (more than 50%) counseling 30 minutes.

## 2021-07-17 NOTE — Lactation Note (Signed)
This note was copied from a baby's chart. Lactation Consultation Note  Patient Name: Stephanie [name] SRPRX'Y Date: 07/17/2021 Reason for consult: Late-preterm 34-36.6wks;Primapara;1st time breastfeeding;NICU baby;Infant < 6lbs Age:26 hours  Visited with mom of 2 hours old LPI NICU female, she's a P1 and reported (+) breast changes during the pregnancy. LC offered assistance with hand expression and pumping but mom politely declined, she didn't want her visitors to leave the room.  Set up a DEBP and explained to her that her RN will be fitting her for her flanges since she doesn't wish to be exposed right now. Reviewed pump settings, pumping schedule, expectations, lactogenesis II and benefits of premature milk for NICU babies.  Maternal Data Has patient been taught Hand Expression?: No Does the patient have breastfeeding experience prior to this delivery?: No  Feeding Mother's Current Feeding Choice: Breast Milk  Lactation Tools Discussed/Used Tools: Pump Breast pump type: Double-Electric Breast Pump Pump Education: Setup, frequency, and cleaning;Milk Storage Reason for Pumping: LPI in NICU Pumping frequency: q 3 hours (recommended)  Interventions Interventions: Breast feeding basics reviewed;DEBP;Education;"The NICU and Your Baby" book;LC Services brochure  Plan of care  Encouraged mom to start pumping every 3 hours, at least 8 pumping sessions/24 hours Breast massage and hand expression were also encouraged prior pumping  GOB (maternal) and family present. All questions and concerns answered, family to call NICU LC PRN.  Discharge Pump: DEBP;Personal (DEBP at home)  Consult Status Consult Status: Follow-up Date: 07/17/21 Follow-up type: In-patient   Stephanie [name] Venetia Constable 07/17/2021, 5:58 PM

## 2021-07-18 ENCOUNTER — Encounter (HOSPITAL_COMMUNITY): Payer: Self-pay | Admitting: Obstetrics and Gynecology

## 2021-07-18 DIAGNOSIS — O1413 Severe pre-eclampsia, third trimester: Secondary | ICD-10-CM | POA: Diagnosis not present

## 2021-07-18 LAB — CBC
HCT: 34.3 % — ABNORMAL LOW (ref 36.0–46.0)
Hemoglobin: 11.2 g/dL — ABNORMAL LOW (ref 12.0–15.0)
MCH: 26.9 pg (ref 26.0–34.0)
MCHC: 32.7 g/dL (ref 30.0–36.0)
MCV: 82.5 fL (ref 80.0–100.0)
Platelets: 395 10*3/uL (ref 150–400)
RBC: 4.16 MIL/uL (ref 3.87–5.11)
RDW: 13.4 % (ref 11.5–15.5)
WBC: 21.9 10*3/uL — ABNORMAL HIGH (ref 4.0–10.5)
nRBC: 0 % (ref 0.0–0.2)

## 2021-07-18 LAB — RPR: RPR Ser Ql: NONREACTIVE

## 2021-07-18 MED ORDER — ENALAPRIL MALEATE 5 MG PO TABS
5.0000 mg | ORAL_TABLET | Freq: Every day | ORAL | Status: DC
  Administered 2021-07-18 – 2021-07-20 (×3): 5 mg via ORAL
  Filled 2021-07-18 (×3): qty 1

## 2021-07-18 NOTE — Progress Notes (Signed)
POSTPARTUM PROGRESS NOTE  POD #1  Subjective:  Stephanie [name] is a 25 y.o. [phone] s/p primary LTCS at [redacted]w[redacted]d for FGR, severe preeclampsia and nonreassuring FHT.   No acute events overnight. She reports she is doing well. She denies any problems with po intake. Denies nausea or vomiting. Pt continues magnesium sulfate recovery which should be done at 1500. She has  passed flatus and is tolerating regular diet. Pain is well controlled.  Lochia is normal.  Pt notes mild headache and blurred vision which she attributes to her magnesium therapy.  Objective: Blood pressure (!) 136/94, pulse 71, temperature 97.6 F (36.4 C), temperature source Oral, resp. rate 19, height 5\' 3"  (1.6 m), weight 106.6 kg, last menstrual period 11/21/2020, SpO2 100 %, unknown if currently breastfeeding.  Physical Exam:  General: alert, cooperative and no distress Chest: no respiratory distress Heart:regular rate, distal pulses intact Abdomen: soft, nondistended, appropriately tender Uterine Fundus: firm, appropriately tender DVT Evaluation: No calf swelling or tenderness Extremities: 1+ edema Skin: warm, dry; incision clean/dry/intact w/ honeycomb dressing in place  Recent Labs    07/17/21 1354 07/18/21 0527  HGB 13.6 11.2*  HCT 42.0 34.3*    Assessment/Plan: Stephanie [name] is a 30 y.o. G2P0111 s/p primary LTCS at [redacted]w[redacted]d for FGR, nonreassuring fetal tracing remote from delivery and severe preeclampsia.  POD#1 -  Enalapril added for BP control, monitor BP Mg sulfate off at 1500 Continue routine postpartum care   LOS: 1 day   [redacted]w[redacted]d, Md Faculty Attending, Center for Mariel Aloe 07/18/2021, 2:38 PM

## 2021-07-18 NOTE — Progress Notes (Signed)
Patient screened out for psychosocial assessment since none of the following apply: °Psychosocial stressors documented in mother or baby's chart °Gestation less than 32 weeks °Code at delivery  °Infant with anomalies °Please contact the Clinical Social Worker if specific needs arise, by MOB's request, or if MOB scores greater than 9/yes to question 10 on Edinburgh Postpartum Depression Screen. ° °Emilina Smarr Boyd-Gilyard, MSW, LCSW °Clinical Social Work °[phone] °  °

## 2021-07-18 NOTE — Lactation Note (Addendum)
This note was copied from a baby's chart. Lactation Consultation Note  Patient Name: Stephanie [name] VFIEP'P Date: 07/18/2021 Reason for consult: Follow-up assessment Age:26 hours  Lactation conducted a routine new admission follow up. [name] just finished pumping and obtained about 5 mls. She was pleased. Her support person took it to the room and was going to ask for labels. I reviewed pumping basics, and we discussed signs that baby would be ready for breast feeding. I also educated on first steps to breast feeding for LPI babies in the NICU.  I reviewed hand expression. [name] was able to demonstrate, and we noted copious colostrum. I recommended that she hand express for 5 minutes after each pumping session emphasizing how removal of colostrum aids in establishing her milk.  Maternal Data Has patient been taught Hand Expression?: Yes Does the patient have breastfeeding experience prior to this delivery?: No  Feeding Mother's Current Feeding Choice: Breast Milk and Donor Milk   Lactation Tools Discussed/Used Tools: Pump Breast pump type: Double-Electric Breast Pump Pump Education: Setup, frequency, and cleaning Reason for Pumping: NICU Pumping frequency: rec q3 hours or 8 times/day Pumped volume: 5 mL  Interventions Interventions: Breast feeding basics reviewed;Hand express;Education  Discharge Pump: DEBP;Personal (Does not recall the brand)  Consult Status Consult Status: Follow-up Date: 07/18/21 Follow-up type: In-patient    Walker Shadow 07/18/2021, 12:59 PM

## 2021-07-19 ENCOUNTER — Other Ambulatory Visit (HOSPITAL_COMMUNITY): Payer: Self-pay

## 2021-07-19 DIAGNOSIS — O1413 Severe pre-eclampsia, third trimester: Secondary | ICD-10-CM | POA: Diagnosis not present

## 2021-07-19 MED ORDER — ENALAPRIL MALEATE 5 MG PO TABS
5.0000 mg | ORAL_TABLET | Freq: Every day | ORAL | 1 refills | Status: DC
  Filled 2021-07-19: qty 30, 30d supply, fill #0

## 2021-07-19 MED ORDER — IBUPROFEN 600 MG PO TABS
600.0000 mg | ORAL_TABLET | Freq: Four times a day (QID) | ORAL | 0 refills | Status: DC
  Filled 2021-07-19: qty 30, 8d supply, fill #0

## 2021-07-19 MED ORDER — FUROSEMIDE 20 MG PO TABS
20.0000 mg | ORAL_TABLET | Freq: Two times a day (BID) | ORAL | 0 refills | Status: DC
  Filled 2021-07-19: qty 8, 4d supply, fill #0

## 2021-07-19 MED ORDER — FUROSEMIDE 40 MG PO TABS
20.0000 mg | ORAL_TABLET | Freq: Two times a day (BID) | ORAL | Status: DC
  Administered 2021-07-19 – 2021-07-20 (×2): 20 mg via ORAL
  Filled 2021-07-19 (×2): qty 1

## 2021-07-19 MED ORDER — OXYCODONE HCL 5 MG PO TABS
5.0000 mg | ORAL_TABLET | ORAL | 0 refills | Status: AC | PRN
  Filled 2021-07-19: qty 30, 5d supply, fill #0

## 2021-07-19 NOTE — Lactation Note (Signed)
This note was copied from a baby's chart.  NICU Lactation Consultation Note  Patient Name: Stephanie [name] EQAST'M Date: 07/19/2021 Age:26 hours   Subjective Reason for consult: Follow-up assessment Mother is pumping q3 day and night. She endorses increased amount of milk when pumping but denies breast fullness at the time of this visit (44 hours pp).  Objective Infant data: Mother's Current Feeding Choice: Breast Milk  Infant feeding assessment Scale for Readiness: 3  Maternal data: G2P0111  C-Section, Low Transverse Current breast feeding challenges:: maternal infant separation  Does the patient have breastfeeding experience prior to this delivery?: No  Pumping frequency: q3/8xday Pumped volume: 40 mL  Pump: Personal (Ameda)  Assessment Maternal: Milk volume: Normal No s/s of engorgement at this time.   Intervention/Plan Interventions: Education; Infant Driven Feeding Algorithm education  Tools: Pump Pump Education: Milk Storage  Plan: Consult Status: Follow-up  NICU Follow-up type: Verify absence of engorgement; Assist with IDF-1 (Mother to pre-pump before breastfeeding)  Mother will continue to pump q3.  Elder Negus 07/19/2021, 11:37 AM

## 2021-07-19 NOTE — Progress Notes (Signed)
POSTPARTUM PROGRESS NOTE  POD #2  Subjective:  Stephanie [name] is a 25 y.o. [id number] s/p primary LTCS at [redacted]w[redacted]d.   No acute events overnight. She reports she is doing well. She denies any problems with ambulating, voiding or po intake. Denies nausea or vomiting. She has  passed flatus. Pain is well controlled.  Lochia is appropriate.  Objective: Blood pressure 139/81, pulse 63, temperature 98.6 F (37 C), temperature source Oral, resp. rate 18, height 5\' 3"  (1.6 m), weight 106.6 kg, last menstrual period 11/21/2020, SpO2 99 %, unknown if currently breastfeeding.  Physical Exam:  General: alert, cooperative and no distress Chest: no respiratory distress Heart:regular rate, distal pulses intact Abdomen: soft, nontender, nondistended, bowel sounds present Uterine Fundus: firm, appropriately tender DVT Evaluation: No calf swelling or tenderness Extremities: 2+ edema Skin: warm, dry; incision clean/dry/intact w/ honeycomb dressing in place  Recent Labs    07/17/21 1354 07/18/21 0527  HGB 13.6 11.2*  HCT 42.0 34.3*    Assessment/Plan: Phil Corti is a 25 y.o. G2P0111 s/p primary LTCS at [redacted]w[redacted]d for severe preeclampsia, FGR and NRFHT.  POD#2 -  Pt desires discharge on 12/3 Start po lasix 20 mg BID x 5 days Continue enalapril, follow BP   LOS: 2 days   14/3, Md Faculty Attending, Center for [facility] 07/19/2021, 12:03 PM

## 2021-07-20 NOTE — Progress Notes (Signed)
Teaching complete  home with husband pt states she has a breast pump to use at home

## 2021-07-20 NOTE — Discharge Summary (Signed)
Postpartum Discharge Summary       Patient Name: Stephanie [name] DOB: 1996-01-26 MRN: [id number]  Date of admission: 07/17/2021 Delivery date:07/17/2021  Delivering provider: Woodroe Mode  Date of discharge: 07/20/2021  Admitting diagnosis: Pregnancy [Z34.90] Intrauterine pregnancy: [redacted]w[redacted]d    Secondary diagnosis:  Principal Problem:   Cesarean delivery delivered Active Problems:   Fetal abnormality in antepartum pregnancy   Obesity in pregnancy, antepartum   Fetal growth restriction antepartum   Severe pre-eclampsia  Additional problems:     Discharge diagnosis: Preterm Pregnancy Delivered                                              Post partum procedures: magnesium Augmentation:  Complications: Non Reassuring FHR tracing  Hospital course: Induction of Labor With Cesarean Section   25y.o. yo G2P0111 at 343w0das admitted to the hospital 07/17/2021 for induction of labor. Patient had a labor course significant for NRFHRT. The patient went for cesarean section due to Non-Reassuring FHR. Delivery details are as follows: Membrane Rupture Time/Date: 3:07 PM ,07/17/2021   Delivery Method:C-Section, Low Transverse  Details of operation can be found in separate operative Note.  Patient had an uncomplicated postpartum course. She is ambulating, tolerating a regular diet, passing flatus, and urinating well.  Patient is discharged home in stable condition on 07/20/21.      Newborn Data: Birth date:07/17/2021  Birth time:3:08 PM  Gender:Female  Living status:Living  Apgars:8 ,9  Weight:1530 g                                Magnesium Sulfate received: Yes: Seizure prophylaxis BMZ received:  Rhophylac: MMNUU:VOZDG-DaP:n/a Flu: No Transfusion:No  Physical exam  Vitals:   07/19/21 1930 07/19/21 2313 07/20/21 0414 07/20/21 0805  BP: 136/81 125/71 126/75 (!) 142/95  Pulse: 67 70 72 66  Resp: 18 18 16 18   Temp: 98.3 F (36.8 C) 98.3 F (36.8 C) 97.8 F (36.6 C) 98.2 F  (36.8 C)  TempSrc: Oral Oral Oral Oral  SpO2: 99% 99% 99% 99%  Weight:      Height:       General: alert, cooperative, and no distress Lochia: appropriate Uterine Fundus: firm Incision: Healing well with no significant drainage DVT Evaluation: No evidence of DVT seen on physical exam. Labs: Lab Results  Component Value Date   WBC 21.9 (H) 07/18/2021   HGB 11.2 (L) 07/18/2021   HCT 34.3 (L) 07/18/2021   MCV 82.5 07/18/2021   PLT 395 07/18/2021   CMP Latest Ref Rng & Units 07/17/2021  Glucose 70 - 99 mg/dL 82  BUN 6 - 20 mg/dL 9  Creatinine 0.44 - 1.00 mg/dL 0.85  Sodium 135 - 145 mmol/L 137  Potassium 3.5 - 5.1 mmol/L 4.2  Chloride 98 - 111 mmol/L 109  CO2 22 - 32 mmol/L 18(L)  Calcium 8.9 - 10.3 mg/dL 8.1(L)  Total Protein 6.5 - 8.1 g/dL 5.6(L)  Total Bilirubin 0.3 - 1.2 mg/dL 0.7  Alkaline Phos 38 - 126 U/L 142(H)  AST 15 - 41 U/L 16  ALT 0 - 44 U/L 11   Edinburgh Score: No flowsheet data found.    After visit meds:  Allergies as of 07/20/2021   No Known Allergies      Medication  List     STOP taking these medications    calcium carbonate 500 MG chewable tablet Commonly known as: TUMS - dosed in mg elemental calcium       TAKE these medications    acetaminophen 500 MG tablet Commonly known as: TYLENOL Take 500 mg by mouth every 6 (six) hours as needed.   enalapril 5 MG tablet Commonly known as: VASOTEC Take 1 tablet (5 mg total) by mouth daily.   furosemide 20 MG tablet Commonly known as: LASIX Take 1 tablet (20 mg total) by mouth 2 (two) times daily for 4 days.   ibuprofen 600 MG tablet Commonly known as: ADVIL Take 1 tablet (600 mg total) by mouth every 6 (six) hours.   oxyCODONE 5 MG immediate release tablet Commonly known as: Oxy IR/ROXICODONE Take 1-2 tablets (5-10 mg total) by mouth every 4 (four) hours as needed for up to 5 days for moderate pain.   prenatal multivitamin Tabs tablet Take 1 tablet by mouth daily at 12 noon.                Discharge Care Instructions  (From admission, onward)           Start     Ordered   07/20/21 0000  Leave dressing on - Keep it clean, dry, and intact until clinic visit        07/20/21 0843             Discharge home in stable condition Infant Feeding: Breast Infant Disposition:NICU Discharge instruction: per After Visit Summary and Postpartum booklet. Activity: Advance as tolerated. Pelvic rest for 6 weeks.  Diet: routine diet Anticipated Birth Control: Condoms Postpartum Appointment:1 week Additional Postpartum F/U: Incision check 1 week and BP check 1 week Future Appointments: Future Appointments  Date Time Provider [place]  07/25/2021  1:30 PM Chancy Milroy, MD [facility] None  08/21/2021 11:15 AM Chancy Milroy, MD [facility] None   Follow up Visit:  [facility] Follow up on 07/26/2021.   Specialty: Obstetrics and Gynecology Why: post op visit, BP check Contact information: [address], [place] [address] [place] [phone]                    07/20/2021 Florian Buff, MD

## 2021-07-21 ENCOUNTER — Ambulatory Visit: Payer: Self-pay

## 2021-07-21 NOTE — Lactation Note (Signed)
This note was copied from a baby's chart. Lactation Consultation Note  Patient Name: Stephanie [name] PTWSF'K Date: 07/21/2021 Reason for consult: Other (Comment);Late-preterm 34-36.6wks;1st time breastfeeding;Primapara;NICU baby;Follow-up assessment;Infant < 6lbs (SGA, maternal discharge on 12/03) Age:26 days  Visited with mom of 93 hours old LPI NICU female, she's a P1 and reports the full onset of lactogenesis II. No S/S of engorgement yet, she just voiced "fullness". Mom was d/c yesterday, reviewed discharge education, engorgement prevention/treatment, sore nipples, pumping schedule, expectations and benefits of STS care.   This LC showed mom how to switch the pump settings from initiation to expression mode now that her milk is in. She also requested extra bottles and breastmilk labels which also were provided.  Maternal Data  Mom's milk is in and her supply is WNL  Feeding Mother's Current Feeding Choice: Breast Milk  Lactation Tools Discussed/Used Tools: Pump Breast pump type: Double-Electric Breast Pump Pump Education: Setup, frequency, and cleaning;Milk Storage Reason for Pumping: LPI in NICU Pumping frequency: 6-7 times/24 hours Pumped volume: 120 mL  Interventions Interventions: Breast feeding basics reviewed;DEBP;Education  Plan of care   Encouraged mom to continue pumping consistently every 3 hours, at least 8 pumping sessions/24 hours Parents will try doing as much STS care as they can with baby, once he gets of phototherapy.   FOB present and supportive. All questions and concerns answered, family to call NICU LC PRN.   Discharge Discharge Education: Engorgement and breast care Pump: DEBP;Personal ([employer] DEBP from insurance)  Consult Status Consult Status: Follow-up Date: 07/21/21 Follow-up type: In-patient   Antonie Borjon Venetia Constable 07/21/2021, 12:16 PM

## 2021-07-22 LAB — SURGICAL PATHOLOGY

## 2021-07-24 ENCOUNTER — Ambulatory Visit: Payer: BC Managed Care – PPO

## 2021-07-25 ENCOUNTER — Encounter: Payer: Self-pay | Admitting: Obstetrics and Gynecology

## 2021-07-25 ENCOUNTER — Other Ambulatory Visit: Payer: Self-pay

## 2021-07-25 ENCOUNTER — Ambulatory Visit (INDEPENDENT_AMBULATORY_CARE_PROVIDER_SITE_OTHER): Payer: BC Managed Care – PPO | Admitting: Obstetrics and Gynecology

## 2021-07-25 VITALS — BP 125/81 | HR 71 | Ht 63.0 in | Wt 207.0 lb

## 2021-07-25 DIAGNOSIS — O1410 Severe pre-eclampsia, unspecified trimester: Secondary | ICD-10-CM

## 2021-07-25 DIAGNOSIS — O1415 Severe pre-eclampsia, complicating the puerperium: Secondary | ICD-10-CM

## 2021-07-25 NOTE — Progress Notes (Signed)
1 week PP here for incision check and BP check.  Baby is still in NICU. C/o earache.

## 2021-07-25 NOTE — Progress Notes (Signed)
Patient ID: Stephanie [name], female   DOB: 21-Nov-1995, 24 y.o.   MRN: [id number] Pt here for PP incision and BP check. S/P LTCS on 07/17/21 On Vasotec for BP Denies HA or visional changes today. No bowel or bladder dysfunction  PE AF VSS Lungs clear Heart RRR Abd soft + BS incision well healed  A/P Post PP check  Doing well. Increase ADL's as tolerates. Continue with Vasotec. F/U with routine PP visit.

## 2021-07-28 ENCOUNTER — Ambulatory Visit: Payer: Self-pay

## 2021-07-28 NOTE — Lactation Note (Signed)
This note was copied from a baby's chart. Lactation Consultation Note  Patient Name: Stephanie [name] Today's Date: 07/28/2021   Age:26 days  TELEPHONE CALL  Attempted to visit with mom today but she didn't come to hospital. NICU RN Tisa voiced that she's brought milk and filled up the entire bin. This LC called mom and spoke to her about her pumping.    Pumping volume: 60-90 ml Pumping frequency: 3-4 times/24 hours   Asked mom what her goals were and she said she'd like to provide exclusively breastmilk if possible. Explained to mom the importance of consistent pumping to protect her supply and discussed some strategies to increase it.    Reviewed power pumping, lactogenesis III and pumping schedule. All questions and concerns answered, mom will call NICU lactation the next time she comes to the hospital.   Debera Lat 07/28/2021, 7:03 PM

## 2021-07-29 ENCOUNTER — Ambulatory Visit: Payer: Self-pay

## 2021-07-29 NOTE — Lactation Note (Signed)
This note was copied from a baby's chart.  NICU Lactation Consultation Note  Patient Name: Stephanie [name] HLKTG'Y Date: 07/29/2021 Age:25   Subjective Reason for consult: Follow-up assessment; Weekly NICU follow-up Mother is pumping about 4x day. Pumping at night is difficult for her and she sometimes wakes up full and uncomfortable. We reviewed importance of frequent milk removal and discussed strategies.   Objective Infant data: Mother's Current Feeding Choice: Breast Milk  Infant feeding assessment Scale for Readiness: 2  Maternal data: G2P0111  C-Section, Low Transverse Pumping frequency: q4 Pumped volume: 120 mL  Risk factor for low milk supply:: infrequent pumping  Pump: DEBP, Personal ([employer] DEBP from insurance)  Assessment Maternal: Milk volume: Normal  Intervention/Plan Interventions: Education  Plan: Consult Status: Follow-up  NICU Follow-up type: Weekly NICU follow up  Mother to increase daytime pumping to q3-4 and pump at least once during the night.   Elder Negus 07/29/2021, 4:53 PM

## 2021-07-30 ENCOUNTER — Telehealth (HOSPITAL_COMMUNITY): Payer: Self-pay | Admitting: *Deleted

## 2021-07-30 NOTE — Telephone Encounter (Signed)
Mom reports feeling good. No concerns about herself at this time. EPDS=4(No hospital score) Mom reports baby is doing well still in the NICU.  Duffy Rhody, RN 07-30-2021 at 3:07pm

## 2021-07-31 ENCOUNTER — Other Ambulatory Visit: Payer: BC Managed Care – PPO

## 2021-07-31 ENCOUNTER — Ambulatory Visit: Payer: BC Managed Care – PPO

## 2021-08-04 ENCOUNTER — Ambulatory Visit: Payer: Self-pay

## 2021-08-04 NOTE — Lactation Note (Signed)
This note was copied from a baby's chart. Lactation Consultation Note  Patient Name: Stephanie [name] INOMV'E Date: 08/04/2021 Reason for consult: Follow-up assessment;Infant < 6lbs;1st time breastfeeding;NICU baby;Late-preterm 34-36.6wks;Primapara;Other (Comment) (IUGR, SGA) Age:26 wk.o.  Visited with mom of 69 26/36 weeks old NICU female, she's a P1 and reports full onset of lactogenesis III. Per mom pumping is going well even though she's not pumping consistently. Asked mom what her goals were and she voiced she'd like to provide as much breastmilk as possible, listing formula as her last resort. Parents are willing to start trying bottles once baby "Stephanie [name]" is ready.  Mom tried to put baby to breast earlier today but he fell asleep. Reviewed feeding cues, expectations, LPI/ETI behavior and pumping schedule. Asked mom to call for latch assistance when baby starts showing feeding cues. This LC also provided parents with extra bottles and labels per their request.  Maternal Data  Mom's supply is WNL despite of decreased pumping  Feeding Mother's Current Feeding Choice: Breast Milk  Lactation Tools Discussed/Used Tools: Pump Breast pump type: Double-Electric Breast Pump Pump Education: Setup, frequency, and cleaning;Milk Storage Reason for Pumping: LPI in NICU Pumping frequency: 5-6 times/24 hours Pumped volume: 180 mL (180-240 ml)  Interventions Interventions: Breast feeding basics reviewed;DEBP;Education  Plan of care   Encouraged mom to try pumping consistently every 3 hours, at least 8 pumping sessions/24 hours Parents will try doing as much STS care as they can and will watch for feeding cues   FOB present and supportive. All questions and concerns answered, parents to call NICU LC PRN.  Discharge Pump: DEBP;Personal ([employer] DEBP from insurance)  Consult Status Consult Status: Follow-up Date: 08/04/21 Follow-up type: In-patient   Kasondra Junod Venetia Constable 08/04/2021, 2:50  PM

## 2021-08-14 ENCOUNTER — Ambulatory Visit: Payer: Self-pay

## 2021-08-14 NOTE — Lactation Note (Signed)
This note was copied from a baby's chart. Lactation Consultation Note  Patient Name: Boy Annya Lizana WUJWJ'X Date: 08/14/2021 Reason for consult: Follow-up assessment;Breastfeeding assistance;Early term 37-38.6wks;Primapara;1st time breastfeeding;NICU baby Age:26 wk.o.  Visited with mom of 69 21/67 weeks old (adjusted) NICU female, she's a P1 and reports that her supply has decreased, possibly due to infrequent pumping and malfunctioning of her pump at home.   Baby already cueing when entered the room, LC assisted with latch to the left breast in cross cradle hold. Baby only did a few sucks at the naked breast, mom's nipples are flat but her tissue is very compressible.  LC attempted to latch again using a NS # 20 and baby was able to latch more consistently; for a total of 11 minutes but only observed transfer during 7. This feeding was interrupted due to a brady event, baby self-released from the breast and paced himself, mom asked LC to show her how to burp baby.  Reviewed IDF algorithm with parents, feeding cues and signs of transfer.  Feeding Mother's Current Feeding Choice: Breast Milk Nipple Type: Nfant Slow Flow (purple)  LATCH Score Latch: Repeated attempts needed to sustain latch, nipple held in mouth throughout feeding, stimulation needed to elicit sucking reflex. (with NS # 20)  Audible Swallowing: Spontaneous and intermittent (on and off during let downs)  Type of Nipple: Flat  Comfort (Breast/Nipple): Soft / non-tender  Hold (Positioning): Assistance needed to correctly position infant at breast and maintain latch.  LATCH Score: 7  Lactation Tools Discussed/Used Tools: Pump Breast pump type: Double-Electric Breast Pump Pump Education: Setup, frequency, and cleaning;Milk Storage Reason for Pumping: ETI in NICU Pumping frequency: 5-6 times/24 hours Pumped volume: 60 mL (60-120 ml)  Interventions Interventions: Breast feeding basics reviewed;DEBP;Education;Assisted  with latch;Breast massage;Hand express;Breast compression;Adjust position;Support pillows  Plan of care   Encouraged mom to try pumping consistently every 3 hours, at least 8 pumping sessions/24 hours She'll call the [employer] customer care center to troubleshoot/get a replacement for her pump She'll try to take baby to breast on feeding cues at least once/day or every time she comes visit to the hospital   FOB present and very supportive. All questions and concerns answered, parents to call NICU LC PRN.  Discharge Pump: DEBP;Personal ([employer])  Consult Status Consult Status: Follow-up Date: 08/14/21 Follow-up type: In-patient   Arvil Utz Venetia Constable 08/14/2021, 2:35 PM

## 2021-08-21 ENCOUNTER — Ambulatory Visit: Payer: BC Managed Care – PPO | Admitting: Obstetrics and Gynecology

## 2021-08-24 ENCOUNTER — Ambulatory Visit: Payer: Self-pay

## 2021-08-24 NOTE — Lactation Note (Signed)
This note was copied from a baby's chart. Lactation Consultation Note  Patient Name: Stephanie [name] WCHEN'I Date: 08/24/2021 Reason for consult: Follow-up assessment;1st time breastfeeding;Primapara;Term;NICU baby Age:27 wk.o.  Visited with mom of 24 42/74 weeks old (adjusted) NICU female, she's a P1 and reports her milk supply is dwindling. Mom has decreased her pumping sessions, explained to her the importance of consistent pumping to protect her supply.  She wishes to have a plan "B" for feeding baby in case her supply continues to dwindle and requested if she can start introducing some formula while at the hospital. Explained to parents that's a conversation they can have with baby's provider, NICU RN Tiffany notified.  Mom reports she's been taking baby to breast once/day when she comes here to visit but hasn't been every day. Parents are currently working on bottle feedings; baby just had a bottle of formula when LC came in the room.   Offered assistance with latch but mom reported they won't be here for the next feeding. Asked mom to call for assistance when needed. Reviewed pumping schedule, guidelines and strategies to increase her supply.  Maternal Data  Mom's supply has slightly increased but is still WNL; probably due to decreased pumping  Feeding Mother's Current Feeding Choice: Breast Milk Nipple Type: Dr. Levert Feinstein Preemie  Lactation Tools Discussed/Used Tools: Pump;Nipple Dorris Carnes Nipple shield size: 20 Breast pump type: Double-Electric Breast Pump Pump Education: Setup, frequency, and cleaning;Milk Storage Reason for Pumping: NICU infant Pumping frequency: 4-5 times/24 hours Pumped volume: 90 mL (90-120 ml)  Interventions Interventions: Breast feeding basics reviewed;DEBP;Education  Plan of care   Encouraged mom to try pumping consistently every 3 hours, at least 8 pumping sessions/24 hours She'll try to take baby to breast on feeding cues at least once/day or  every time she comes visit to the hospital; she'll use NS # 20 PRN   FOB present and very supportive. All questions and concerns answered, parents to call NICU LC PRN.  Discharge Pump: DEBP;Personal ([employer])  Consult Status Consult Status: Follow-up Date: 08/24/21 Follow-up type: In-patient   Zadie Deemer Venetia Constable 08/24/2021, 1:00 PM

## 2021-08-30 ENCOUNTER — Ambulatory Visit: Payer: Self-pay

## 2021-08-30 NOTE — Lactation Note (Deleted)
This note was copied from a baby's chart.  NICU Lactation Consultation Note  Patient Name: Stephanie [name] PXTGG'Y Date: 08/30/2021 Age:27 wk.o.   Subjective Reason for consult: Follow-up assessment; Weekly NICU follow-up Mother continues to pump frequently. Her plan is to pump and bottle feed only. Baby is advancing bottles at this time. Mother requests additional labels for breast milk; LC provided.  Objective Infant data: Mother's Current Feeding Choice: Breast Milk  Infant feeding assessment Scale for Readiness: 1 Scale for Quality: 1     Maternal data: [id number]  C-Section, Low Transverse Pumping frequency: q3 Pumped volume: 90 mL  Pump: DEBP, Personal ([employer])  Assessment Infant: Feeding Status: Ad lib   Maternal: Milk volume: Normal   Intervention/Plan Interventions: Education  Plan: Consult Status: Follow-up  NICU Follow-up type: Weekly NICU follow up    Elder Negus 08/30/2021, 12:40 PM

## 2021-10-01 ENCOUNTER — Telehealth (INDEPENDENT_AMBULATORY_CARE_PROVIDER_SITE_OTHER): Payer: BC Managed Care – PPO | Admitting: Advanced Practice Midwife

## 2021-10-01 ENCOUNTER — Encounter: Payer: Self-pay | Admitting: Advanced Practice Midwife

## 2021-10-01 ENCOUNTER — Telehealth: Payer: Self-pay | Admitting: Advanced Practice Midwife

## 2021-10-01 DIAGNOSIS — O1410 Severe pre-eclampsia, unspecified trimester: Secondary | ICD-10-CM

## 2021-10-01 DIAGNOSIS — Z3009 Encounter for other general counseling and advice on contraception: Secondary | ICD-10-CM

## 2021-10-01 MED ORDER — SLYND 4 MG PO TABS: 1.0000 | ORAL_TABLET | Freq: Every day | ORAL | 11 refills | Status: DC

## 2021-10-01 NOTE — Progress Notes (Addendum)
GYNECOLOGY VIRTUAL VISIT ENCOUNTER NOTE  Provider location: Center for [facility]   Patient location: Home  I connected with Stephanie [name] on 10/01/21 at 10:15 AM EST by MyChart Video Encounter and verified that I am speaking with the correct person using two identifiers.   I discussed the limitations, risks, security and privacy concerns of performing an evaluation and management service virtually and the availability of in person appointments. I also discussed with the patient that there may be a patient responsible charge related to this service. The patient expressed understanding and agreed to proceed.      Post Partum Visit Note  Stephanie [name] is a 26 y.o. [phone] female who presents for a postpartum visit. She is 10 weeks postpartum following a primary cesarean section.  I have fully reviewed the prenatal and intrapartum course. The delivery was at 34.0 gestational weeks.  Anesthesia: epidural. Postpartum course has been unremarkable. Baby is doing well. Baby is feeding by both breast and bottle - neosure . Bleeding no bleeding. Bowel function is normal. Bladder function is normal. Patient is sexually active. Contraception method is none. Postpartum depression screening: negative.   Upstream - 10/01/21 1029       Pregnancy Intention Screening   Does the patient want to become pregnant in the next year? No    Does the patient's partner want to become pregnant in the next year? No    Would the patient like to discuss contraceptive options today? Yes            The pregnancy intention screening data noted above was reviewed. Potential methods of contraception were discussed. The patient elected to proceed with No data recorded.   Edinburgh Postnatal Depression Scale - 10/01/21 1026       Edinburgh Postnatal Depression Scale:  In the Past 7 Days   I have been able to laugh and see the funny side of things. 0    I have looked forward with enjoyment  to things. 0    I have blamed myself unnecessarily when things went wrong. 0    I have been anxious or worried for no good reason. 0    I have felt scared or panicky for no good reason. 0    Things have been getting on top of me. 0    I have been so unhappy that I have had difficulty sleeping. 0    I have felt sad or miserable. 0    I have been so unhappy that I have been crying. 0    The thought of harming myself has occurred to me. 0    Edinburgh Postnatal Depression Scale Total 0             Health Maintenance Due  Topic Date Due   COVID-19 Vaccine (1) Never done   PAP-Cervical Cytology Screening  Never done   PAP SMEAR-Modifier  Never done    The following portions of the patient's history were reviewed and updated as appropriate: allergies, current medications, past family history, past medical history, past social history, past surgical history, and problem list.  Review of Systems Pertinent items noted in HPI and remainder of comprehensive ROS otherwise negative.  Objective:  There were no vitals taken for this visit.   BP reported by pt on home cuff as 116/72  General:  Alert, oriented and cooperative. Patient appears to be in no acute distress.  Mental Status: Normal mood and affect. Normal behavior. Normal judgment  and thought content.   Respiratory: Normal respiratory effort, no problems with respiration noted  Rest of physical exam deferred due to type of encounter  Assessment:   1. Encounter for counseling regarding contraception --Discussed pt contraceptive plans and reviewed contraceptive methods based on pt preferences and effectiveness.  Pt prefers to start POPs, considering IUD. --F/U in 3 months  - Drospirenone (SLYND) 4 MG TABS; Take 1 tablet by mouth daily.  Dispense: 28 tablet; Refill: 11  2. Postpartum care following cesarean delivery --Doing well, no pain, bonding well with baby, good support at home  3. Severe pre-eclampsia, antepartum -No  longer taking at home BP meds, pt to take BP today and notify office  4. Cesarean delivery delivered   Plan:   Essential components of care per ACOG recommendations:  1.  Mood and well being: Patient with negative depression screening today. Reviewed local resources for support.  - Patient tobacco use? No.   - hx of drug use? No.    2. Infant care and feeding:  -Patient currently breastmilk feeding? Yes. Reviewed importance of draining breast regularly to support lactation.  -Social determinants of health (SDOH) reviewed in EPIC. No concerns     3. Sexuality, contraception and birth spacing - Patient does not want a pregnancy in the next year.   - Reviewed forms of contraception in tiered fashion. Patient desired oral progesterone-only contraceptive today.   - Discussed birth spacing of 18 months  4. Sleep and fatigue -Encouraged family/partner/community support of 4 hrs of uninterrupted sleep to help with mood and fatigue  5. Physical Recovery  - Discussed patients delivery and complications.  - Patient had a C-section emergent for fetal intolerance of labor. - Patient has urinary incontinence? No. - Patient is safe to resume physical and sexual activity  6.  Health Maintenance - HM due items addressed Yes - Last pap smear 08/2020 wnl Pap smear not done at today's visit.  -Breast Cancer screening indicated? No.   7. Chronic Disease/Pregnancy Condition follow up: Hypertension  - PCP follow up  I discussed the assessment and treatment plan with the patient. The patient was provided an opportunity to ask questions and all were answered. The patient agreed with the plan and demonstrated an understanding of the instructions.   The patient was advised to call back or seek an in-person evaluation/go to the ED if the symptoms worsen or if the condition fails to improve as anticipated.  I provided 10 minutes of face-to-face time during this encounter.  Sharen Counter,  CNM 9:51 AM  Center for [employer], [facility]

## 2021-10-01 NOTE — Telephone Encounter (Signed)
Pt did not take BP before today's virtual visit so no BP is documented. I called patient and she is able to take BP on home cuff but is not home to do this at the time of our call.  I will send pt a Mychart message and she will respond with her BP taken at home today.  Pt is no longer taking Vasotec, prescribed at hospital discharge for HTN.  She has a PCP and will follow up in 2-3 months for check up/BP check visit. She denies any s/sx of PEC today.

## 2021-11-04 ENCOUNTER — Encounter: Payer: Self-pay | Admitting: Advanced Practice Midwife

## 2022-04-01 IMAGING — US US MFM UA CORD DOPPLER
1 series · 13 of 28 positions shown · non-contrast
Comparison: none

[Series 1: us mfm ua cord doppler · 40 acquisitions, 13 frames shown]
[im 2/40]
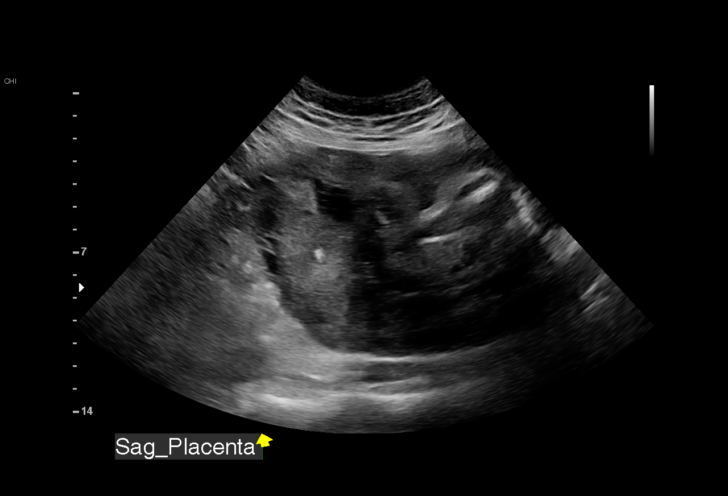
[im 5/40]
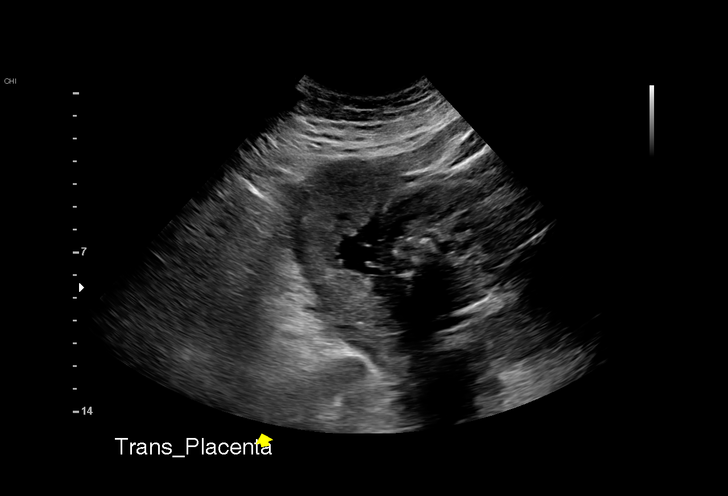
[im 8/40]
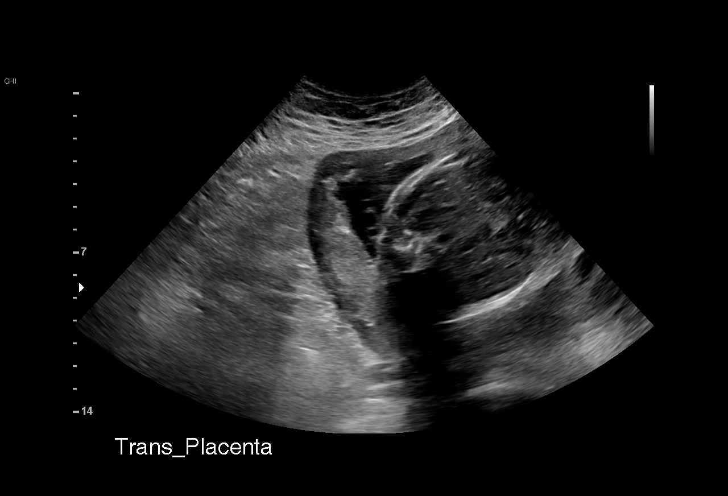
[im 11/40]
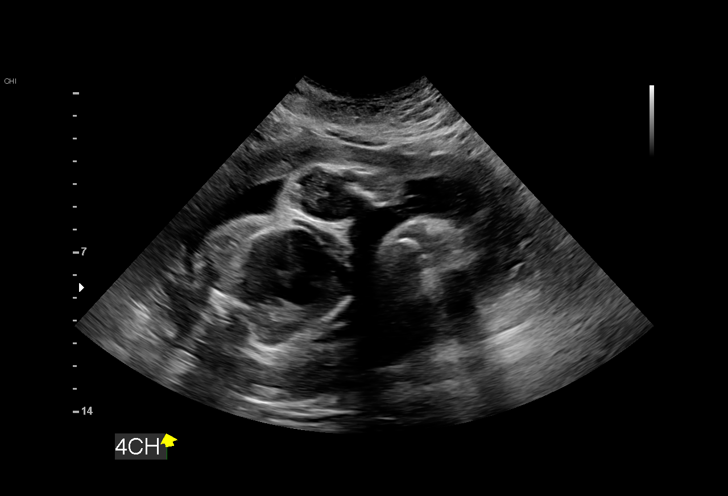
[im 14/40]
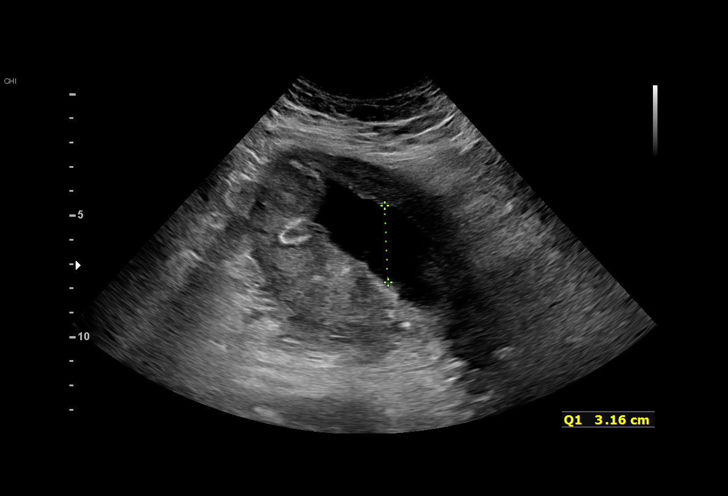
[im 16/40]
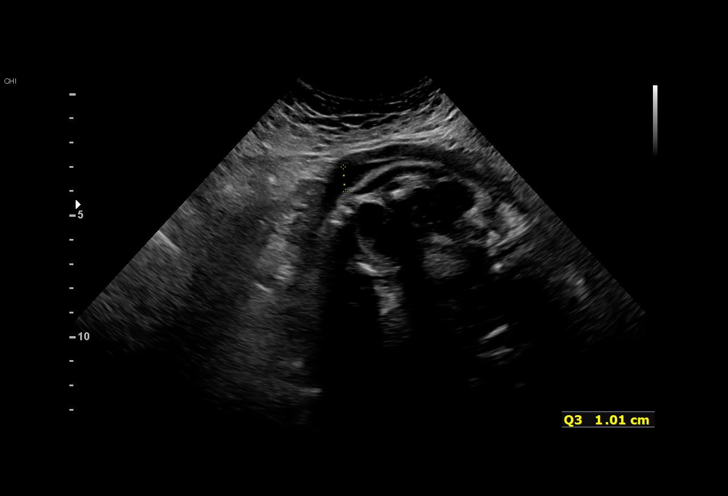
[im 21/40]
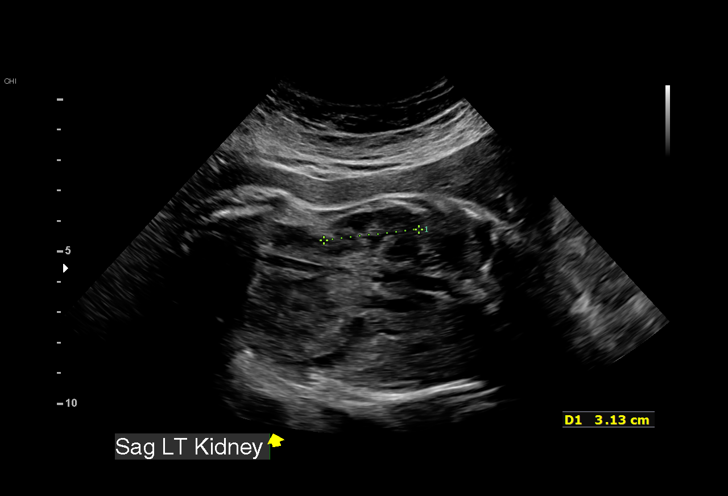
[im 24/40]
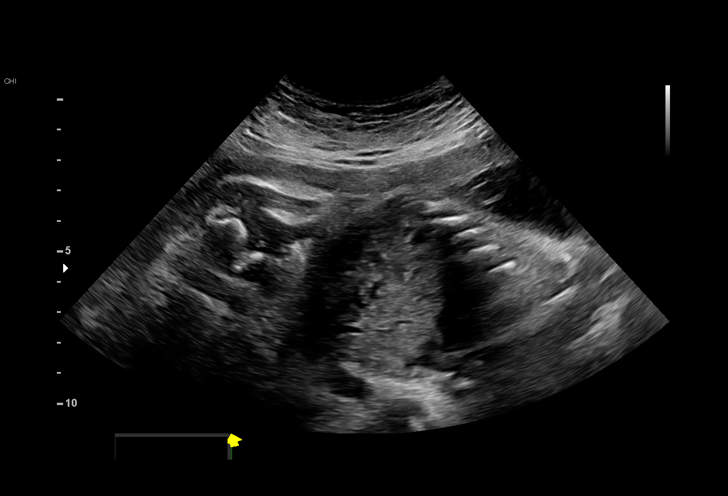
[im 27/40]
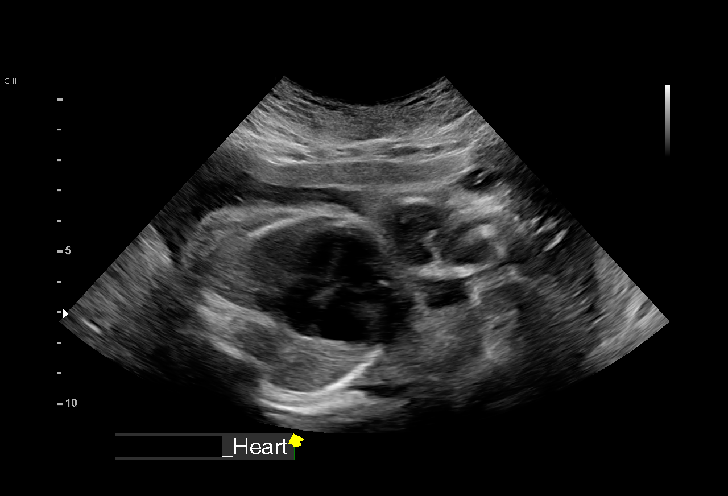
[im 29/40]
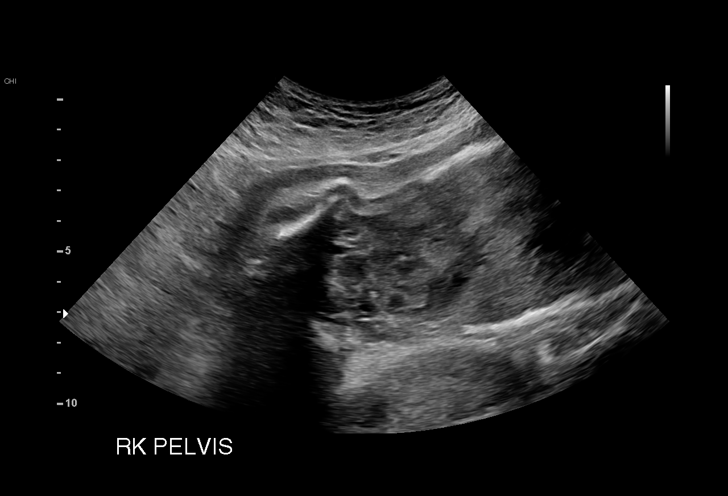
[im 32/40]
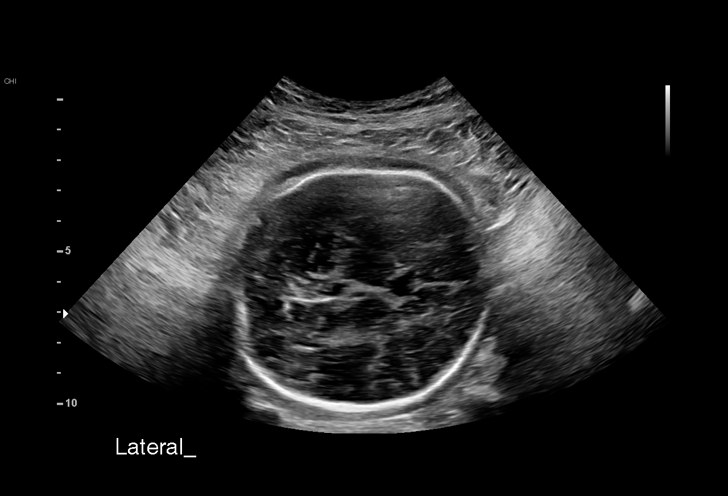
[im 35/40]
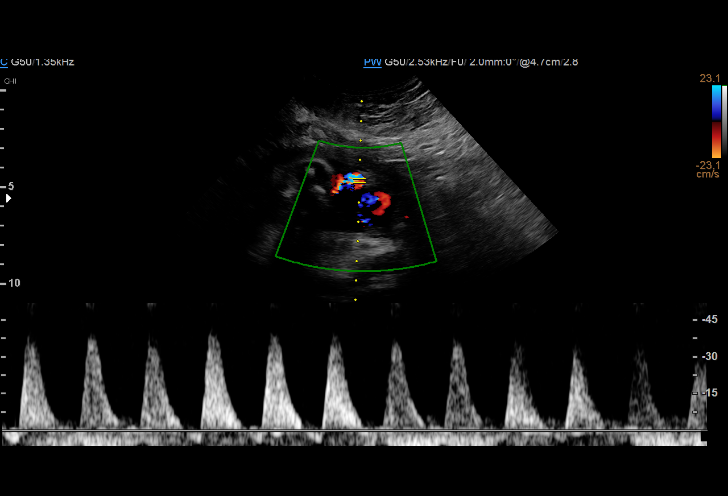
[im 38/40]
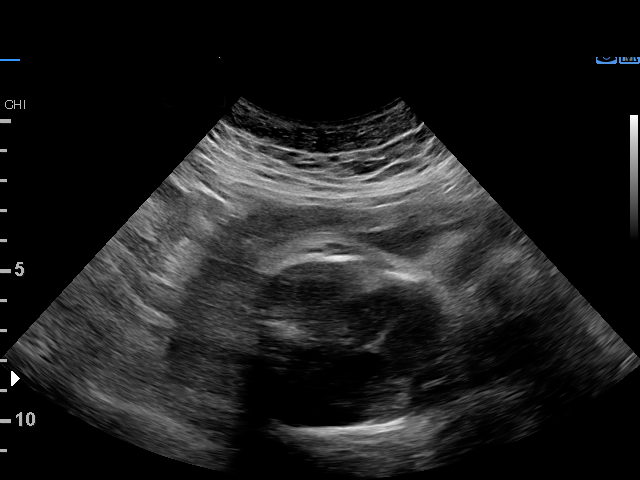

[13 of 28 positions shown; findings below may reference images not displayed]

W/NONSTRESS

Indications

 Maternal care for known or suspected poor
 fetal growth, third trimester, not applicable or
 unspecified IUGR
 Obesity complicating pregnancy, third
 trimester (pregravid BMI 32)
 Fetal abnormality - other known or
 suspected (Right Pelvic Kidney)
 Low Risk NIPS/ Negative Horizon/Negative
 AFP
 34 weeks gestation of pregnancy
 Unspecified preeclampsia, third trimester
Fetal Evaluation

 Num Of Fetuses:         1
 Fetal Heart Rate(bpm):  137
 Cardiac Activity:       Observed
 Presentation:           Cephalic
 Placenta:               Posterior
 P. Cord Insertion:      Previously Visualized

 Amniotic Fluid
 AFI FV:      Subjectively low-normal

 AFI Sum(cm)     %Tile       Largest Pocket(cm)
 8.91            11
 RUQ(cm)       RLQ(cm)       LUQ(cm)        LLQ(cm)

Biophysical Evaluation

 Amniotic F.V:   Pocket => 2 cm             F. Tone:        Observed
 F. Movement:    Not Observed               N.S.T:          Nonreactive
 F. Breathing:   Observed                   Score:          [DATE]
Biometry

 LV:        4.5  mm
OB History

 Gravidity:    2         Term:   0        Prem:   0        SAB:   1
 TOP:          0       Ectopic:  0        Living: 0
Gestational Age

 LMP:           34w 0d        Date:  11/21/20                 EDD:   08/28/21
 Best:          34w 0d     Det. By:  LMP  (11/21/20)          EDD:   08/28/21
Anatomy

 Heart:                 Appears normal         Kidneys:                POPE WNL; ABD EL WAHEB pelvis
                        (4CH, axis, and
                        situs)
 Diaphragm:             Appears normal         Bladder:                Appears normal
 Stomach:               Not visualized
Doppler - Fetal Vessels

 Umbilical Artery
                                                                     RDFV
                                                                       Yes

Impression

 On today's ultrasound, amniotic fluid is normal but decreased
 fetal activity was seen.  Fetal movements did not meet the
 criteria of BPP.  Umbilical artery Doppler showed persistent
 reversed end-diastolic flow. BPP [DATE] (NST is not reactive but
 incomplete; less than 20 minutes).
 xxxxxxxxxxxxxxxxxxxxxxxxxxxxxxxxxxxxxxxxxxxxxxxxxxxxxxxxx
 Consultation (see [REDACTED] )

 Ms. Take, Ramroma1 P0 at 34 weeks' gestation with preeclampsia
 returned for antenatal testing. Severe fetal growth restriction
 was diagnosed 2 weeks ago and the estimated fetal weight
 was at the 1st percentile.
 Patient had labs drawn at the [facility] then and platelets, liver
 enzymes and creatinine were within normal range. Of note,
 right fetal pelvic kidney was seen at previous ultrasound.

 Patient complained of decreased fetal movements for about 3
 days.

 I counseled the patient on the finding of abnormal umbilical
 artery Doppler study.  Reversed end-diastolic flow is
 associated with increased risk of perinatal mortality and
 morbidity.  Given that patient had decreased fetal movements
 I recommended going to the hospital for delivery.  I discussed
 the possibility of antenatal corticosteroids if NST is reactive at
 the hospital.
 I informed the patient that cesarean delivery is more-likely
 mode of delivery.

 I discussed with Dr. Chuc, OB attending and learned that our
 NICU is full and will not be able to accept transfers.  I called
 [facility] and spoke with Center for
 call the transfer line.  I called the transfer line and was
 informed that they will call back in 15 minutes after
 discussing with the NICU and [HOSPITAL].
 We decided to perform NST well the phone calls were being
 made.  No variability was seen and 1 deceleration down to
 120 bpm was seen (see NST report). We could not complete
 the NST.
 Because of the urgency of her clinical condition, I discussed
 with Dr. Chuc who decided to accept the patient at the [facility].

 I counseled the couple on the bed situation and the likelihood
 of delivery at [HOSPITAL], [HOSPITAL], and possible
 transfer of the newborn to another hospital.
 The couple agreed with my recommendations and proceeded
 to the [facility].
Recommendations

 -I recommend delivery today WITHOUT antenatal
 corticosteroids.
                 Masirag, Weiguang

## 2023-09-11 ENCOUNTER — Ambulatory Visit: Payer: No Typology Code available for payment source

## 2023-09-11 VITALS — BP 113/73 | HR 67 | Ht 63.0 in | Wt 169.0 lb

## 2023-09-11 DIAGNOSIS — Z3201 Encounter for pregnancy test, result positive: Secondary | ICD-10-CM

## 2023-09-11 LAB — POCT URINE PREGNANCY: Preg Test, Ur: POSITIVE — AB

## 2023-09-11 NOTE — Progress Notes (Signed)
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 She has no unusual complaints. LMP: 08/04/2023    OBJECTIVE: Appears well, in no apparent distress.  OB History     Gravida  3   Para  1   Term  0   Preterm  1   AB  1   Living  1      SAB  1   IAB  0   Ectopic  0   Multiple  0   Live Births  1          Home UPT Result: POSITIVE X3 In-Office UPT result: POSITIVE  I have reviewed the patient's medical, obstetrical, social, and family histories, and medications.   ASSESSMENT: Positive pregnancy test LMP 08/04/2023 EDD  05/10/2024 GA     [redacted]w[redacted]d  PLAN Prenatal care to be completed at: [facility]  Make appointments for NOB Nurse Intake and NOB visits.

## 2023-09-20 ENCOUNTER — Encounter: Payer: Self-pay | Admitting: Advanced Practice Midwife

## 2023-09-27 ENCOUNTER — Inpatient Hospital Stay (HOSPITAL_COMMUNITY): Payer: No Typology Code available for payment source

## 2023-09-27 ENCOUNTER — Inpatient Hospital Stay (HOSPITAL_COMMUNITY)
Admission: AD | Admit: 2023-09-27 | Discharge: 2023-09-27 | Disposition: A | Discharge status: Home / Self Care | Payer: No Typology Code available for payment source | Attending: Obstetrics and Gynecology | Admitting: Obstetrics and Gynecology

## 2023-09-27 ENCOUNTER — Encounter (HOSPITAL_COMMUNITY): Payer: Self-pay | Admitting: Obstetrics and Gynecology

## 2023-09-27 DIAGNOSIS — O468X1 Other antepartum hemorrhage, first trimester: Secondary | ICD-10-CM

## 2023-09-27 DIAGNOSIS — B9689 Other specified bacterial agents as the cause of diseases classified elsewhere: Secondary | ICD-10-CM

## 2023-09-27 DIAGNOSIS — N8311 Corpus luteum cyst of right ovary: Secondary | ICD-10-CM | POA: Insufficient documentation

## 2023-09-27 DIAGNOSIS — O418X10 Other specified disorders of amniotic fluid and membranes, first trimester, not applicable or unspecified: Secondary | ICD-10-CM | POA: Diagnosis not present

## 2023-09-27 DIAGNOSIS — O23591 Infection of other part of genital tract in pregnancy, first trimester: Secondary | ICD-10-CM | POA: Insufficient documentation

## 2023-09-27 DIAGNOSIS — Z3A01 Less than 8 weeks gestation of pregnancy: Secondary | ICD-10-CM

## 2023-09-27 DIAGNOSIS — O26851 Spotting complicating pregnancy, first trimester: Secondary | ICD-10-CM | POA: Insufficient documentation

## 2023-09-27 DIAGNOSIS — O3481 Maternal care for other abnormalities of pelvic organs, first trimester: Secondary | ICD-10-CM | POA: Diagnosis not present

## 2023-09-27 DIAGNOSIS — N760 Acute vaginitis: Secondary | ICD-10-CM | POA: Diagnosis not present

## 2023-09-27 HISTORY — DX: Gestational (pregnancy-induced) hypertension without significant proteinuria, unspecified trimester: O13.9

## 2023-09-27 LAB — URINALYSIS, ROUTINE W REFLEX MICROSCOPIC
Bilirubin Urine: NEGATIVE
Glucose, UA: NEGATIVE mg/dL
Hgb urine dipstick: NEGATIVE
Ketones, ur: NEGATIVE mg/dL
Leukocytes,Ua: NEGATIVE
Nitrite: NEGATIVE
Protein, ur: NEGATIVE mg/dL
Specific Gravity, Urine: 1.012 (ref 1.005–1.030)
pH: 6 (ref 5.0–8.0)

## 2023-09-27 LAB — COMPREHENSIVE METABOLIC PANEL
ALT: 23 U/L (ref 0–44)
AST: 12 U/L — ABNORMAL LOW (ref 15–41)
Albumin: 3.7 g/dL (ref 3.5–5.0)
Alkaline Phosphatase: 61 U/L (ref 38–126)
Anion gap: 11 (ref 5–15)
BUN: 8 mg/dL (ref 6–20)
CO2: 22 mmol/L (ref 22–32)
Calcium: 8.7 mg/dL — ABNORMAL LOW (ref 8.9–10.3)
Chloride: 103 mmol/L (ref 98–111)
Creatinine, Ser: 0.67 mg/dL (ref 0.44–1.00)
GFR, Estimated: >60 mL/min (ref >60)
Glucose, Bld: 64 mg/dL — ABNORMAL LOW (ref 70–99)
Potassium: 3.7 mmol/L (ref 3.5–5.1)
Sodium: 136 mmol/L (ref 135–145)
Total Bilirubin: 1.3 mg/dL — ABNORMAL HIGH (ref 0.0–1.2)
Total Protein: 6.7 g/dL (ref 6.5–8.1)

## 2023-09-27 LAB — CBC
HCT: 42.6 % (ref 36.0–46.0)
Hemoglobin: 14.7 g/dL (ref 12.0–15.0)
MCH: 29.7 pg (ref 26.0–34.0)
MCHC: 34.5 g/dL (ref 30.0–36.0)
MCV: 86.1 fL (ref 80.0–100.0)
Platelets: 345 10*3/uL (ref 150–400)
RBC: 4.95 MIL/uL (ref 3.87–5.11)
RDW: 12.1 % (ref 11.5–15.5)
WBC: 11.5 10*3/uL — ABNORMAL HIGH (ref 4.0–10.5)
nRBC: 0 % (ref 0.0–0.2)

## 2023-09-27 LAB — ABO/RH: ABO/RH(D): O POS

## 2023-09-27 LAB — WET PREP, GENITAL
Sperm: NONE SEEN
Trich, Wet Prep: NONE SEEN
WBC, Wet Prep HPF POC: <10 (ref <10)
Yeast Wet Prep HPF POC: NONE SEEN

## 2023-09-27 LAB — HCG, QUANTITATIVE, PREGNANCY: hCG, Beta Chain, Quant, S: 110431 m[IU]/mL — ABNORMAL HIGH (ref <5)

## 2023-09-27 MED ORDER — METRONIDAZOLE 0.75 % VA GEL: 1.0000 | Freq: Every day | VAGINAL | 1 refills | Status: DC

## 2023-09-27 NOTE — MAU Provider Note (Signed)
 Chief Complaint: Vaginal Bleeding  SUBJECTIVE HPI: Stephanie [name] is a 28 y.o. [id number] at [redacted]w[redacted]d by LMP who presents to maternity admissions reporting spotting.  Patient presents with brown spotting on and off for the past week. Not wearing a pad. No clots, passage a tissue. Denies urinary symptoms, fever/chills, cramping, change in discharge, abdominal pain.  Taking PNV. Has initial OB visit scheduled.  HPI  Past Medical History:  Diagnosis Date   Pregnancy induced hypertension    Past Surgical History:  Procedure Laterality Date   CESAREAN SECTION  07/17/2021   Procedure: CESAREAN SECTION;  Surgeon: Zina Jerilynn LABOR, MD;  Location: [facility];  Service: Obstetrics;;   WISDOM TOOTH EXTRACTION Bilateral    Social History   Socioeconomic History   Marital status: Single    Spouse name: Not on file   Number of children: Not on file   Years of education: Not on file   Highest education level: Not on file  Occupational History   Not on file  Tobacco Use   Smoking status: Never   Smokeless tobacco: Never  Vaping Use   Vaping status: Never Used  Substance and Sexual Activity   Alcohol use: Never   Drug use: Never   Sexual activity: Yes    Partners: Male    Birth control/protection: None  Other Topics Concern   Not on file  Social History Narrative   Not on file   Social Drivers of Health   Financial Resource Strain: Not on file  Food Insecurity: Not on file  Transportation Needs: Not on file  Physical Activity: Not on file  Stress: Not on file  Social Connections: Not on file  Intimate Partner Violence: Not on file   No current facility-administered medications on file prior to encounter.   Current Outpatient Medications on File Prior to Encounter  Medication Sig Dispense Refill   Prenatal Vit-Fe Fumarate-FA (PRENATAL MULTIVITAMIN) TABS tablet Take 1 tablet by mouth daily at 12 noon.     acetaminophen  (TYLENOL ) 500 MG tablet Take 500 mg by mouth every 6 (six)  hours as needed. (Patient not taking: Reported on 10/01/2021)     Drospirenone  (SLYND ) 4 MG TABS Take 1 tablet by mouth daily. (Patient not taking: Reported on 09/11/2023) 28 tablet 11   enalapril  (VASOTEC ) 5 MG tablet Take 1 tablet (5 mg total) by mouth daily. (Patient not taking: Reported on 10/01/2021) 30 tablet 1   ibuprofen  (ADVIL ) 600 MG tablet Take 1 tablet (600 mg total) by mouth every 6 (six) hours. 30 tablet 0   No Known Allergies  ROS:  Pertinent positives/negatives listed above.  I have reviewed patient's Past Medical Hx, Surgical Hx, Family Hx, Social Hx, medications and allergies.   Physical Exam  Patient Vitals for the past 24 hrs:  BP Temp Temp src Pulse Resp SpO2 Height Weight  09/27/23 1427 111/63 98 F (36.7 C) Oral 64 17 99 % 5' 3 (1.6 m) 77.2 kg   Constitutional: Well-developed, well-nourished female in no acute distress  Cardiovascular: normal rate Respiratory: normal effort GI: Abd soft, non-tender. Pos BS x 4 MS: Extremities nontender, no edema, normal ROM Neurologic: Alert and oriented x 4 GU: Neg CVAT  LAB RESULTS Results for orders placed or performed during the hospital encounter of 09/27/23 (from the past 24 hours)  Wet prep, genital     Status: Abnormal   Collection Time: 09/27/23  2:35 PM   Specimen: Urine, Clean Catch  Result Value Ref Range   Yeast  Wet Prep HPF POC NONE SEEN NONE SEEN   Trich, Wet Prep NONE SEEN NONE SEEN   Clue Cells Wet Prep HPF POC PRESENT (A) NONE SEEN   WBC, Wet Prep HPF POC <10 <10   Sperm NONE SEEN   Urinalysis, Routine w reflex microscopic -Urine, Clean Catch     Status: None   Collection Time: 09/27/23  2:35 PM  Result Value Ref Range   Color, Urine YELLOW YELLOW   APPearance CLEAR CLEAR   Specific Gravity, Urine 1.012 1.005 - 1.030   pH 6.0 5.0 - 8.0   Glucose, UA NEGATIVE NEGATIVE mg/dL   Hgb urine dipstick NEGATIVE NEGATIVE   Bilirubin Urine NEGATIVE NEGATIVE   Ketones, ur NEGATIVE NEGATIVE mg/dL   Protein, ur  NEGATIVE NEGATIVE mg/dL   Nitrite NEGATIVE NEGATIVE   Leukocytes,Ua NEGATIVE NEGATIVE  CBC     Status: Abnormal   Collection Time: 09/27/23  2:47 PM  Result Value Ref Range   WBC 11.5 (H) 4.0 - 10.5 K/uL   RBC 4.95 3.87 - 5.11 MIL/uL   Hemoglobin 14.7 12.0 - 15.0 g/dL   HCT 57.3 63.9 - 53.9 %   MCV 86.1 80.0 - 100.0 fL   MCH 29.7 26.0 - 34.0 pg   MCHC 34.5 30.0 - 36.0 g/dL   RDW 87.8 88.4 - 84.4 %   Platelets 345 150 - 400 K/uL   nRBC 0.0 0.0 - 0.2 %  Comprehensive metabolic panel     Status: Abnormal   Collection Time: 09/27/23  2:47 PM  Result Value Ref Range   Sodium 136 135 - 145 mmol/L   Potassium 3.7 3.5 - 5.1 mmol/L   Chloride 103 98 - 111 mmol/L   CO2 22 22 - 32 mmol/L   Glucose, Bld 64 (L) 70 - 99 mg/dL   BUN 8 6 - 20 mg/dL   Creatinine, Ser 9.32 0.44 - 1.00 mg/dL   Calcium 8.7 (L) 8.9 - 10.3 mg/dL   Total Protein 6.7 6.5 - 8.1 g/dL   Albumin 3.7 3.5 - 5.0 g/dL   AST 12 (L) 15 - 41 U/L   ALT 23 0 - 44 U/L   Alkaline Phosphatase 61 38 - 126 U/L   Total Bilirubin 1.3 (H) 0.0 - 1.2 mg/dL   GFR, Estimated >39 >39 mL/min   Anion gap 11 5 - 15  ABO/Rh     Status: None   Collection Time: 09/27/23  2:47 PM  Result Value Ref Range   ABO/RH(D) O POS    No rh immune globuloin      NOT A RH IMMUNE GLOBULIN CANDIDATE, PT RH POSITIVE Performed at [facility], [address], [place], [place] [address]     --/--/O POS (02/09 1447)  IMAGING US  OB LESS THAN 14 WEEKS WITH OB TRANSVAGINAL Result Date: 09/27/2023 CLINICAL DATA:  Brownish spotting for 1 week EXAM: OBSTETRIC <14 WK US  AND TRANSVAGINAL OB US  TECHNIQUE: Both transabdominal and transvaginal ultrasound examinations were performed for complete evaluation of the gestation as well as the maternal uterus, adnexal regions, and pelvic cul-de-sac. Transvaginal technique was performed to assess early pregnancy. COMPARISON:  None Available. FINDINGS: Intrauterine gestational sac: Single Yolk sac:  Visualized. Embryo:   Visualized. Cardiac Activity: Visualized. Heart Rate: 148 bpm CRL:  11.8 mm   7 w   3 d                  US  EDC: 05/12/2024 Subchorionic hemorrhage: There is a small  subchorionic hemorrhage along the inferior margin of the gestational sac, measuring 1.1 x 0.7 x 1.0 cm. Maternal uterus/adnexae: There are no adnexal masses. Corpus luteal cyst is seen within the right ovary. Trace pelvic free fluid. IMPRESSION: 1. Single live intrauterine pregnancy as above, estimated age 58 weeks and 3 days. 2. Small subchorionic hemorrhage, measuring up to 1.1 cm. Electronically Signed   By: Ozell Daring M.D.   On: 09/27/2023 15:47    MAU Management/MDM: Orders Placed This Encounter  Procedures   Wet prep, genital   US  OB LESS THAN 14 WEEKS WITH OB TRANSVAGINAL   CBC   Comprehensive metabolic panel   hCG, quantitative, pregnancy   Urinalysis, Routine w reflex microscopic -Urine, Clean Catch   ABO/Rh   Discharge patient    Meds ordered this encounter  Medications   metroNIDAZOLE  (METROGEL ) 0.75 % vaginal gel    Sig: Place 1 Applicatorful vaginally at bedtime.    Dispense:  70 g    Refill:  1    Patient presents with spotting in first trimester without confirmed IUP. Ectopic work-up performed. Revealed viable 7 week IUP with subchorionic hemorrhage and BV infection. Rh positive. Will treat with metrogel  for BV. Return precautions for miscarriage given.  Taking PNV. Has initial OB appointment with [facility] later this week.  ASSESSMENT 1. Bacterial vaginosis   2. Subchorionic hematoma in first trimester, single or unspecified fetus   3. [redacted] weeks gestation of pregnancy     PLAN Discharge home with strict return precautions. Allergies as of 09/27/2023   No Known Allergies      Medication List     STOP taking these medications    acetaminophen  500 MG tablet Commonly known as: TYLENOL    enalapril  5 MG tablet Commonly known as: VASOTEC    ibuprofen  600 MG tablet Commonly known as: ADVIL    Slynd   4 MG Tabs Generic drug: Drospirenone        TAKE these medications    metroNIDAZOLE  0.75 % vaginal gel Commonly known as: METROGEL  Place 1 Applicatorful vaginally at bedtime.   prenatal multivitamin Tabs tablet Take 1 tablet by mouth daily at 12 noon.         Almarie Moats, MD OB Fellow 09/27/2023  3:52 PM

## 2023-09-27 NOTE — MAU Note (Addendum)
 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 No clots. No change, not any heavier.  Denies pain.  LMP: 12/17 Onset of complaint: about a wk  Vitals:   09/27/23 1427  BP: 111/63  Pulse: 64  Resp: 17  Temp: 98 F (36.7 C)  SpO2: 99%      Lab orders placed from triage: vag swabs

## 2023-09-28 LAB — GC/CHLAMYDIA PROBE AMP (~~LOC~~) NOT AT ARMC
Chlamydia: NEGATIVE
Comment: NEGATIVE
Comment: NORMAL
Neisseria Gonorrhea: NEGATIVE

## 2023-10-01 ENCOUNTER — Encounter: Payer: Self-pay | Admitting: Advanced Practice Midwife

## 2023-10-02 ENCOUNTER — Other Ambulatory Visit (INDEPENDENT_AMBULATORY_CARE_PROVIDER_SITE_OTHER): Payer: Self-pay

## 2023-10-02 ENCOUNTER — Ambulatory Visit: Payer: No Typology Code available for payment source

## 2023-10-02 VITALS — BP 111/74 | HR 70

## 2023-10-02 DIAGNOSIS — O99011 Anemia complicating pregnancy, first trimester: Secondary | ICD-10-CM

## 2023-10-02 DIAGNOSIS — Z3A08 8 weeks gestation of pregnancy: Secondary | ICD-10-CM

## 2023-10-02 DIAGNOSIS — O99019 Anemia complicating pregnancy, unspecified trimester: Secondary | ICD-10-CM

## 2023-10-02 DIAGNOSIS — O0990 Supervision of high risk pregnancy, unspecified, unspecified trimester: Secondary | ICD-10-CM

## 2023-10-02 DIAGNOSIS — Z362 Encounter for other antenatal screening follow-up: Secondary | ICD-10-CM

## 2023-10-02 DIAGNOSIS — O0991 Supervision of high risk pregnancy, unspecified, first trimester: Secondary | ICD-10-CM | POA: Diagnosis not present

## 2023-10-02 DIAGNOSIS — E559 Vitamin D deficiency, unspecified: Secondary | ICD-10-CM

## 2023-10-02 DIAGNOSIS — Z1339 Encounter for screening examination for other mental health and behavioral disorders: Secondary | ICD-10-CM | POA: Diagnosis not present

## 2023-10-02 MED ORDER — PROMETHAZINE HCL 25 MG PO TABS: 25.0000 mg | ORAL_TABLET | Freq: Four times a day (QID) | ORAL | 1 refills | Status: DC | PRN

## 2023-10-02 MED ORDER — DOXYLAMINE-PYRIDOXINE 10-10 MG PO TBEC: 2.0000 | DELAYED_RELEASE_TABLET | Freq: Every day | ORAL | 5 refills | Status: DC

## 2023-10-02 NOTE — Patient Instructions (Signed)

## 2023-10-02 NOTE — Progress Notes (Signed)
New OB Intake  I connected with Norberta Keens  on 10/02/23 at  8:15 AM EST by In Person Visit and verified that I am speaking with the correct person using two identifiers. Nurse is located at [facility] and pt is located at [facility].  I discussed the limitations, risks, security and privacy concerns of performing an evaluation and management service by telephone and the availability of in person appointments. I also discussed with the patient that there may be a patient responsible charge related to this service. The patient expressed understanding and agreed to proceed.  I explained I am completing New OB Intake today. We discussed EDD of 05/10/2024, Date entered prior to episode creation. Pt is [id number]. I reviewed her allergies, medications and Medical/Surgical/OB history.    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 Discussed the nature of our practice with multiple providers including residents and students. Due to the size of the practice, the delivering provider may not be the same as those providing prenatal care.   Patient is not a candidate for water birth. Offered upcoming OB visit with CNM to discuss further.  MyChart/Babyscripts MyChart access verified. I explained pt will have some visits in office and some virtually. Babyscripts instructions given and order placed. Patient verifies receipt of registration text/e-mail. Account successfully created and app downloaded. If patient is a candidate for Optimized scheduling, add to sticky note.   Blood Pressure Cuff/Weight Scale Patient has private insurance; instructed to purchase blood pressure cuff and bring to first prenatal appt. Explained after first prenatal appt pt will check weekly and document in Babyscripts. Patient does not have weight scale; patient may  purchase if they desire to track weight weekly in Babyscripts.  Anatomy US Explained first scheduled [place] will be around 19 weeks. Anatomy US scheduled for TBD at TBD.  Interested in [place]? If yes, send referral and doula dot phrase.   Is patient a candidate for Babyscripts Optimization? No, due to Risk Factors   First visit review I reviewed new OB appt with patient. Explained pt will be seen by Albertine Grates, NP at first visit. Discussed Avelina Laine genetic screening with patient. Requests Panorama. Routine prenatal labs  OB Urine only collected at today's visit. Initial OB labs and Panorama deferred to New OB appt.    Last Pap No results found for: "DIAGPAP"  Harrel Lemon, RN 10/02/2023  8:23 AM

## 2023-10-04 LAB — URINE CULTURE, OB REFLEX

## 2023-10-04 LAB — CULTURE, OB URINE

## 2023-10-19 ENCOUNTER — Encounter: Payer: BC Managed Care – PPO | Admitting: Obstetrics and Gynecology

## 2023-10-23 ENCOUNTER — Encounter: Payer: Self-pay | Admitting: Obstetrics and Gynecology

## 2023-10-23 ENCOUNTER — Ambulatory Visit: Payer: BC Managed Care – PPO | Admitting: Obstetrics and Gynecology

## 2023-10-23 VITALS — BP 113/74 | HR 70 | Wt 168.0 lb

## 2023-10-23 DIAGNOSIS — Z8751 Personal history of pre-term labor: Secondary | ICD-10-CM | POA: Diagnosis not present

## 2023-10-23 DIAGNOSIS — Z98891 History of uterine scar from previous surgery: Secondary | ICD-10-CM | POA: Diagnosis not present

## 2023-10-23 DIAGNOSIS — O0990 Supervision of high risk pregnancy, unspecified, unspecified trimester: Secondary | ICD-10-CM

## 2023-10-23 DIAGNOSIS — Z8759 Personal history of other complications of pregnancy, childbirth and the puerperium: Secondary | ICD-10-CM | POA: Diagnosis not present

## 2023-10-23 DIAGNOSIS — O0991 Supervision of high risk pregnancy, unspecified, first trimester: Secondary | ICD-10-CM | POA: Diagnosis not present

## 2023-10-23 DIAGNOSIS — Z3A11 11 weeks gestation of pregnancy: Secondary | ICD-10-CM

## 2023-10-23 MED ORDER — ASPIRIN 81 MG PO TBEC: 162.0000 mg | DELAYED_RELEASE_TABLET | Freq: Every day | ORAL | 2 refills | Status: DC

## 2023-10-23 NOTE — Progress Notes (Signed)
 INITIAL PRENATAL VISIT  Subjective:   Stephanie [name] is being seen today for her first obstetrical visit.  She is at [redacted]w[redacted]d gestation by LMP Her obstetrical history is significant for  history of preeclampsia, history of previous cesarean section , history of preterm delivery. Relationship with FOB: spouse, living together. Patient does intend to breast feed. Pregnancy history fully reviewed.  Patient reports no complaints.  Indications for ASA therapy (per uptodate) One of the following: Previous pregnancy with preeclampsia, especially early onset and with an adverse outcome Yes Multifetal gestation No Chronic hypertension No Type 1 or 2 diabetes mellitus No Chronic kidney disease No Autoimmune disease (antiphospholipid syndrome, systemic lupus erythematosus) No   Indications for early GDM screening  First-degree relative with diabetes No BMI >30kg/m2 No Age > 25 Yes Previous birth of an infant weighing >=4000 g No Gestational diabetes mellitus in a previous pregnancy No Glycated hemoglobin >=5.7 percent (39 mmol/mol), impaired glucose tolerance, or impaired fasting glucose on previous testing No High-risk race/ethnicity (eg, African American, Latino, Native American, [place] American, Pacific Islander) Yes  Early screening tests: FBS, A1C, Random CBG, glucose challenge   Objective:    Obstetric History OB History  Gravida Para Term Preterm AB Living  3 1 0 1 1 1   SAB IAB Ectopic Multiple Live Births  1 0 0 0 1    # Outcome Date GA Lbr Len/2nd Weight Sex Type Anes PTL Lv  3 Current           2 Preterm 07/17/21 [redacted]w[redacted]d  3 lb 6 oz (1.53 kg) M CS-LTranv Spinal  LIV  1 SAB             Obstetric Comments  2022 induced for  BP    Past Medical History:  Diagnosis Date   Pregnancy induced hypertension     Past Surgical History:  Procedure Laterality Date   CESAREAN SECTION  07/17/2021   Procedure: CESAREAN SECTION;  Surgeon: Warden Fillers, MD;  Location: [facility];   Service: Obstetrics;;   WISDOM TOOTH EXTRACTION Bilateral     Current Outpatient Medications on File Prior to Visit  Medication Sig Dispense Refill   Cholecalciferol 125 MCG (5000 UT) TABS Take 500 Units by mouth daily.     Prenatal Vit-Fe Fumarate-FA (PRENATAL MULTIVITAMIN) TABS tablet Take 1 tablet by mouth daily at 12 noon.     Doxylamine-Pyridoxine (DICLEGIS) 10-10 MG TBEC Take 2 tablets by mouth at bedtime. If symptoms persist, add one tablet in the morning and one in the afternoon (Patient not taking: Reported on 10/23/2023) 100 tablet 5   ferrous sulfate 325 (65 FE) MG tablet Take 325 mg by mouth every other day. (Patient not taking: Reported on 10/23/2023)     metroNIDAZOLE (METROGEL) 0.75 % vaginal gel Place 1 Applicatorful vaginally at bedtime. (Patient not taking: Reported on 10/23/2023) 70 g 1   promethazine (PHENERGAN) 25 MG tablet Take 1 tablet (25 mg total) by mouth every 6 (six) hours as needed for nausea or vomiting. (Patient not taking: Reported on 10/23/2023) 30 tablet 1   No current facility-administered medications on file prior to visit.    No Known Allergies  Social History:  reports that she has never smoked. She has never used smokeless tobacco. She reports that she does not drink alcohol and does not use drugs.  Family History  Problem Relation Age of Onset   Migraines Mother    Hypertension Father    Asthma Brother    Diabetes  Maternal Grandmother     The following portions of the patient's history were reviewed and updated as appropriate: allergies, current medications, past family history, past medical history, past social history, past surgical history and problem list.  Review of Systems Review of Systems  All other systems reviewed and are negative.   Physical Exam:  BP 113/74   Pulse 70   Wt 168 lb (76.2 kg)   LMP 08/04/2023 (Exact Date)   BMI 29.76 kg/m  CONSTITUTIONAL: Well-developed, well-nourished female in no acute distress.  HENT:   Normocephalic, atraumatic.   EYES: Conjunctivae normal. NECK: Normal range of motion SKIN: Skin is warm and dry. MUSCULOSKELETAL: Normal range of motion NEUROLOGIC: Alert and oriented  PSYCHIATRIC: Normal mood and affect. Normal behavior.  CARDIOVASCULAR: Normal heart rate noted RESPIRATORY: normal effort ABDOMEN: Soft PELVIC:deferred   Fetal Heart Rate (bpm): 158   Movement: Present   Assessment:    Pregnancy: [id number]  1. Supervision of high risk pregnancy, antepartum (Primary) BP and FHR normal   - PANORAMA PRENATAL TEST - CBC/D/Plt+RPR+Rh+ABO+RubIgG... - HgB A1c - aspirin EC 81 MG tablet; Take 2 tablets (162 mg total) by mouth daily. Start taking when you are [redacted] weeks pregnant for rest of pregnancy for prevention of preeclampsia  Dispense: 300 tablet; Refill: 2  2. History of pre-eclampsia 2022 severe pre-eclampsia, admitted at 34 weeks  Discussed recommendation for ASA during pregnancy, rx sent for 162mg  Discussion regarding when to notify regarding bp or go to hospital  - Protein / creatinine ratio, urine - aspirin EC 81 MG tablet; Take 2 tablets (162 mg total) by mouth daily. Start taking when you are [redacted] weeks pregnant for rest of pregnancy for prevention of preeclampsia  Dispense: 300 tablet; Refill: 2  3. History of cesarean section Previous delivery d/t pre-e and NRFHR Desires TOLAC  4. History of preterm delivery See above     Plan:     Initial labs drawn. Prenatal vitamins. Problem list reviewed and updated. Reviewed in detail the nature of the practice with collaborative care between  Genetic screening discussed: NIPS/First trimester screen/Quad/AFP ordered. Role of ultrasound in pregnancy discussed; Anatomy US: ordered. Follow up in 4 weeks. Weight gain recommendations per IOM guidelines reviewed: underweight/BMI 18.5 or less > 28 - 40 lbs; normal weight/BMI 18.5 - 24.9 > 25 - 35 lbs; overweight/BMI 25 - 29.9 > 15 - 25 lbs; obese/BMI  30 or more > 11 -  20 lbs.  Discussed clinic routines, schedule of care and testing, genetic screening options, involvement of students and residents under the direct supervision of APPs and doctors and presence of female providers. Pt verbalized understanding.  Future Appointments  Date Time Provider Department Center  12/22/2023  8:00 AM [facility] NURSE [facility]  12/22/2023  8:15 AM [facility] PROVIDER 1 [facility]  12/22/2023  8:30 AM [facility] US3 [facility]    Sue Lush, FNP

## 2023-10-23 NOTE — Addendum Note (Signed)
 Addended by: Gavin Potters on: 10/23/2023 09:09 AM   Modules accepted: Orders

## 2023-10-23 NOTE — Progress Notes (Signed)
 Pt presents for ROB visit. Wants to defer PAP PP.

## 2023-10-24 LAB — CBC/D/PLT+RPR+RH+ABO+RUBIGG...
Antibody Screen: NEGATIVE
Basophils Absolute: 0 10*3/uL (ref 0.0–0.2)
Basos: 0 %
EOS (ABSOLUTE): 0.1 10*3/uL (ref 0.0–0.4)
Eos: 1 %
HCV Ab: NONREACTIVE
HIV Screen 4th Generation wRfx: NONREACTIVE
Hematocrit: 41.5 % (ref 34.0–46.6)
Hemoglobin: 14.4 g/dL (ref 11.1–15.9)
Hepatitis B Surface Ag: NEGATIVE
Immature Grans (Abs): 0 10*3/uL (ref 0.0–0.1)
Immature Granulocytes: 0 %
Lymphocytes Absolute: 1.5 10*3/uL (ref 0.7–3.1)
Lymphs: 16 %
MCH: 29.8 pg (ref 26.6–33.0)
MCHC: 34.7 g/dL (ref 31.5–35.7)
MCV: 86 fL (ref 79–97)
Monocytes Absolute: 0.4 10*3/uL (ref 0.1–0.9)
Monocytes: 4 %
Neutrophils Absolute: 7.2 10*3/uL — ABNORMAL HIGH (ref 1.4–7.0)
Neutrophils: 79 %
Platelets: 322 10*3/uL (ref 150–450)
RBC: 4.84 x10E6/uL (ref 3.77–5.28)
RDW: 12.5 % (ref 11.7–15.4)
RPR Ser Ql: NONREACTIVE
Rh Factor: POSITIVE
Rubella Antibodies, IGG: 1.58 {index} (ref >0.99)
WBC: 9.2 10*3/uL (ref 3.4–10.8)

## 2023-10-24 LAB — HCV INTERPRETATION

## 2023-10-24 LAB — HEMOGLOBIN A1C
Est. average glucose Bld gHb Est-mCnc: 103 mg/dL
Hgb A1c MFr Bld: 5.2 % (ref 4.8–5.6)

## 2023-10-25 LAB — PROTEIN / CREATININE RATIO, URINE
Creatinine, Urine: 155.7 mg/dL
Protein, Ur: 14.2 mg/dL
Protein/Creat Ratio: 91 mg/g{creat} (ref 0–200)

## 2023-10-31 LAB — PANORAMA PRENATAL TEST FULL PANEL:PANORAMA TEST PLUS 5 ADDITIONAL MICRODELETIONS: FETAL FRACTION: 7.2

## 2023-11-02 ENCOUNTER — Encounter: Payer: Self-pay | Admitting: Advanced Practice Midwife

## 2023-11-05 ENCOUNTER — Encounter: Payer: Self-pay | Admitting: Advanced Practice Midwife

## 2023-11-17 NOTE — Progress Notes (Unsigned)
   PRENATAL VISIT NOTE  Subjective:  Stephanie [name] is a 28 y.o. [phone] at [redacted]w[redacted]d being seen today for ongoing prenatal care.  She is currently monitored for the following issues for this {Blank single:19197::"high-risk","low-risk"} pregnancy and has Learning problem; Postpartum care following cesarean delivery; and Supervision of high risk pregnancy, antepartum on their problem list.  Patient reports {sx:14538}.   .  .   . Denies leaking of fluid.   The following portions of the patient's history were reviewed and updated as appropriate: allergies, current medications, past family history, past medical history, past social history, past surgical history and problem list.   Objective:  There were no vitals filed for this visit.  Fetal Status:           General:  Alert, oriented and cooperative. Patient is in no acute distress.  Skin: Skin is warm and dry. No rash noted.   Cardiovascular: Normal heart rate noted  Respiratory: Normal respiratory effort, no problems with respiration noted  Abdomen: Soft, gravid, appropriate for gestational age.        Pelvic: Cervical exam deferred        Extremities: Normal range of motion.     Mental Status: Normal mood and affect. Normal behavior. Normal judgment and thought content.   Assessment and Plan:  Pregnancy: [id number] at [redacted]w[redacted]d  1. Supervision of high risk pregnancy, antepartum (Primary) Patient is doing well BP, FHR, FH appropriate  2. [redacted] weeks gestation of pregnancy Anticipatory guidance about next visits/weeks of pregnancy given  3. History of severe pre-eclampsia Continue ASA Normotensive, no current meds Normal baseline labs Growth [place] scheduled*** Ssxs preE reviewed   4. History of cesarean section G2, 34w *** Patient desires TOLAC  Preterm labor symptoms and general obstetric precautions including but not limited to vaginal bleeding, contractions, leaking of fluid and fetal movement were reviewed in detail with the  patient.  Please refer to After Visit Summary for other counseling recommendations.   No follow-ups on file.  Future Appointments  Date Time Provider Department Center  11/20/2023  8:35 AM Christean Leaf [facility] None  12/25/2023  8:00 AM [facility] PROVIDER 1 [facility]  12/25/2023  8:30 AM [facility] US3 [facility]    Ralene Muskrat, PA-C

## 2023-11-20 ENCOUNTER — Ambulatory Visit: Admitting: Physician Assistant

## 2023-11-20 VITALS — BP 119/72 | HR 84 | Wt 171.4 lb

## 2023-11-20 DIAGNOSIS — O0990 Supervision of high risk pregnancy, unspecified, unspecified trimester: Secondary | ICD-10-CM

## 2023-11-20 DIAGNOSIS — Z98891 History of uterine scar from previous surgery: Secondary | ICD-10-CM

## 2023-11-20 DIAGNOSIS — Z8759 Personal history of other complications of pregnancy, childbirth and the puerperium: Secondary | ICD-10-CM | POA: Diagnosis not present

## 2023-11-20 DIAGNOSIS — Z3A15 15 weeks gestation of pregnancy: Secondary | ICD-10-CM | POA: Diagnosis not present

## 2023-11-20 NOTE — Progress Notes (Signed)
 Pt presents for rob. Pt has no questions or concerns at this time.

## 2023-11-22 LAB — AFP, SERUM, OPEN SPINA BIFIDA
AFP MoM: 0.58
AFP Value: 16.2 ng/mL
Gest. Age on Collection Date: 15.4 wk
Maternal Age At EDD: 29.6 a
OSBR Risk 1 IN: 10000
Test Results:: NEGATIVE
Weight: 171 [lb_av]

## 2023-11-29 ENCOUNTER — Inpatient Hospital Stay (HOSPITAL_COMMUNITY)
Admission: AD | Admit: 2023-11-29 | Discharge: 2023-11-29 | Disposition: A | Discharge status: Home / Self Care | Attending: Obstetrics & Gynecology | Admitting: Obstetrics & Gynecology

## 2023-11-29 ENCOUNTER — Other Ambulatory Visit: Payer: Self-pay

## 2023-11-29 ENCOUNTER — Encounter (HOSPITAL_COMMUNITY): Payer: Self-pay | Admitting: Obstetrics & Gynecology

## 2023-11-29 DIAGNOSIS — R110 Nausea: Secondary | ICD-10-CM | POA: Diagnosis not present

## 2023-11-29 DIAGNOSIS — Z3A16 16 weeks gestation of pregnancy: Secondary | ICD-10-CM | POA: Insufficient documentation

## 2023-11-29 DIAGNOSIS — R519 Headache, unspecified: Secondary | ICD-10-CM | POA: Diagnosis not present

## 2023-11-29 DIAGNOSIS — Z98891 History of uterine scar from previous surgery: Secondary | ICD-10-CM

## 2023-11-29 DIAGNOSIS — O26892 Other specified pregnancy related conditions, second trimester: Secondary | ICD-10-CM | POA: Insufficient documentation

## 2023-11-29 DIAGNOSIS — Z8759 Personal history of other complications of pregnancy, childbirth and the puerperium: Secondary | ICD-10-CM

## 2023-11-29 DIAGNOSIS — Z8751 Personal history of pre-term labor: Secondary | ICD-10-CM

## 2023-11-29 DIAGNOSIS — O34219 Maternal care for unspecified type scar from previous cesarean delivery: Secondary | ICD-10-CM

## 2023-11-29 LAB — URINALYSIS, ROUTINE W REFLEX MICROSCOPIC
Bilirubin Urine: NEGATIVE
Glucose, UA: NEGATIVE mg/dL
Hgb urine dipstick: NEGATIVE
Ketones, ur: NEGATIVE mg/dL
Leukocytes,Ua: NEGATIVE
Nitrite: NEGATIVE
Protein, ur: NEGATIVE mg/dL
Specific Gravity, Urine: 1.012 (ref 1.005–1.030)
pH: 7 (ref 5.0–8.0)

## 2023-11-29 MED ORDER — METOCLOPRAMIDE HCL 10 MG PO TABS
10.0000 mg | ORAL_TABLET | Freq: Once | ORAL | Status: AC
  Administered 2023-11-29: 10 mg via ORAL
  Filled 2023-11-29: qty 1

## 2023-11-29 MED ORDER — METOCLOPRAMIDE HCL 10 MG PO TABS: 10.0000 mg | ORAL_TABLET | Freq: Four times a day (QID) | ORAL | 0 refills | Status: DC | PRN

## 2023-11-29 MED ORDER — ACETAMINOPHEN 500 MG PO TABS
1000.0000 mg | ORAL_TABLET | Freq: Once | ORAL | Status: AC
  Administered 2023-11-29: 1000 mg via ORAL
  Filled 2023-11-29: qty 2

## 2023-11-29 NOTE — MAU Provider Note (Signed)
 History     CSN: [id number]  Arrival date and time: 11/29/23 1325   Event Date/Time   First Provider Initiated Contact with Patient 11/29/23 1443      Chief Complaint  Patient presents with   Headache   HPI  Stephanie [name] is a 28 y.o. female G3P0111 @ [redacted]w[redacted]d here with an acute headache that started 3 days ago. She has tried 1 extra strength tylenol which has helped only some. The HA is located in her temples, and earlier she felt like her head was hot. She had some nausea, no vomting.  OB History     Gravida  3   Para  1   Term  0   Preterm  1   AB  1   Living  1      SAB  1   IAB  0   Ectopic  0   Multiple  0   Live Births  1        Obstetric Comments  2022 induced for  BP         Past Medical History:  Diagnosis Date   Pregnancy induced hypertension     Past Surgical History:  Procedure Laterality Date   CESAREAN SECTION  07/17/2021   Procedure: CESAREAN SECTION;  Surgeon: Abigail Abler, MD;  Location: [facility]S;  Service: Obstetrics;;   WISDOM TOOTH EXTRACTION Bilateral     Family History  Problem Relation Age of Onset   Migraines Mother    Hypertension Father    Asthma Brother    Diabetes Maternal Grandmother     Social History   Tobacco Use   Smoking status: Never   Smokeless tobacco: Never  Vaping Use   Vaping status: Never Used  Substance Use Topics   Alcohol use: Never   Drug use: Never    Allergies: No Known Allergies  Medications Prior to Admission  Medication Sig Dispense Refill Last Dose/Taking   aspirin EC 81 MG tablet Take 2 tablets (162 mg total) by mouth daily. Start taking when you are [redacted] weeks pregnant for rest of pregnancy for prevention of preeclampsia 300 tablet 2 11/29/2023   Cholecalciferol 125 MCG (5000 UT) TABS Take 500 Units by mouth daily.   11/29/2023   doxylamine, Sleep, (UNISOM) 25 MG tablet Take 25 mg by mouth at bedtime as needed (nausea).   11/28/2023   Prenatal Vit-Fe Fumarate-FA  (PRENATAL MULTIVITAMIN) TABS tablet Take 1 tablet by mouth daily at 12 noon.   11/29/2023   pyridOXINE (VITAMIN B6) 25 MG tablet Take 25 mg by mouth at bedtime as needed (nausea).   11/28/2023   Doxylamine-Pyridoxine (DICLEGIS) 10-10 MG TBEC Take 2 tablets by mouth at bedtime. If symptoms persist, add one tablet in the morning and one in the afternoon (Patient not taking: Reported on 11/20/2023) 100 tablet 5    ferrous sulfate 325 (65 FE) MG tablet Take 325 mg by mouth every other day. (Patient not taking: Reported on 11/20/2023)      metroNIDAZOLE (METROGEL) 0.75 % vaginal gel Place 1 Applicatorful vaginally at bedtime. (Patient not taking: Reported on 11/20/2023) 70 g 1    promethazine (PHENERGAN) 25 MG tablet Take 1 tablet (25 mg total) by mouth every 6 (six) hours as needed for nausea or vomiting. (Patient not taking: Reported on 11/20/2023) 30 tablet 1    Results for orders placed or performed during the hospital encounter of 11/29/23 (from the past 48 hours)  Urinalysis, Routine w reflex microscopic -Urine,  Clean Catch     Status: Abnormal   Collection Time: 11/29/23  2:16 PM  Result Value Ref Range   Color, Urine YELLOW YELLOW   APPearance CLOUDY (A) CLEAR   Specific Gravity, Urine 1.012 1.005 - 1.030   pH 7.0 5.0 - 8.0   Glucose, UA NEGATIVE NEGATIVE mg/dL   Hgb urine dipstick NEGATIVE NEGATIVE   Bilirubin Urine NEGATIVE NEGATIVE   Ketones, ur NEGATIVE NEGATIVE mg/dL   Protein, ur NEGATIVE NEGATIVE mg/dL   Nitrite NEGATIVE NEGATIVE   Leukocytes,Ua NEGATIVE NEGATIVE    Comment: Performed at [facility], [address], [place], [place] [address]    Review of Systems  Eyes:  Negative for photophobia and visual disturbance.  Neurological:  Positive for headaches. Negative for dizziness and facial asymmetry.   Physical Exam   Blood pressure 106/60, pulse 80, temperature 98.3 F (36.8 C), temperature source Oral, resp. rate 18, last menstrual period 08/04/2023, SpO2 100%, currently  breastfeeding.  Physical Exam Constitutional:      General: She is not in acute distress.    Appearance: She is well-developed. She is not ill-appearing, toxic-appearing or diaphoretic.  Neurological:     Mental Status: She is alert and oriented to person, place, and time.     GCS: GCS eye subscore is 4. GCS verbal subscore is 5. GCS motor subscore is 6.  Psychiatric:        Behavior: Behavior normal.     MAU Course  Procedures  MDM  Tylenol 1 gram given PO Reglan given 10 mg HA pain down from 6/10 to 1/10  Assessment and Plan   A:  1. Pregnancy headache in second trimester   2. Nausea   3. [redacted] weeks gestation of pregnancy      P:  Dc home Return to MAU if symptoms worsen Rx: Reglan Ok to taken tylenol as needed OTC  Eulalie Speights, Juliette Oh, NP 11/29/2023 5:47 PM

## 2023-11-29 NOTE — MAU Note (Signed)
.  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 at [redacted]w[redacted]d here in MAU reporting: ongoing headache for the past 3 days.  Pt states headache was at its worst last night but "feels like its coming back".  Pt reports she took one extra strength tylenol at 0230.  Pt denies abd pain, vag bleeding or LOF   Onset of complaint: 3 days  Vitals:   11/29/23 1344  Pulse: 95  Resp: 17  Temp: 98.3 F (36.8 C)     FHT: 145 Lab orders placed from triage: ua

## 2023-12-18 ENCOUNTER — Ambulatory Visit (INDEPENDENT_AMBULATORY_CARE_PROVIDER_SITE_OTHER): Admitting: Obstetrics and Gynecology

## 2023-12-18 ENCOUNTER — Encounter: Payer: Self-pay | Admitting: Obstetrics and Gynecology

## 2023-12-18 VITALS — BP 118/74 | HR 69 | Wt 174.4 lb

## 2023-12-18 DIAGNOSIS — Z8751 Personal history of pre-term labor: Secondary | ICD-10-CM | POA: Diagnosis not present

## 2023-12-18 DIAGNOSIS — Z3A19 19 weeks gestation of pregnancy: Secondary | ICD-10-CM | POA: Diagnosis not present

## 2023-12-18 DIAGNOSIS — O0990 Supervision of high risk pregnancy, unspecified, unspecified trimester: Secondary | ICD-10-CM | POA: Diagnosis not present

## 2023-12-18 DIAGNOSIS — Z98891 History of uterine scar from previous surgery: Secondary | ICD-10-CM | POA: Diagnosis not present

## 2023-12-18 DIAGNOSIS — Z8759 Personal history of other complications of pregnancy, childbirth and the puerperium: Secondary | ICD-10-CM | POA: Insufficient documentation

## 2023-12-18 NOTE — Progress Notes (Signed)
   PRENATAL VISIT NOTE  Subjective:  Stephanie [name] is a 28 y.o. [phone] at [redacted]w[redacted]d being seen today for ongoing prenatal care.  She is currently monitored for the following issues for this high-risk pregnancy and has Learning problem; History of severe pre-eclampsia; Supervision of high risk pregnancy, antepartum; History of preterm delivery; History of cesarean section; and History of fetal growth restriction on their problem list.  Patient reports doing well overall. Has dry/chapped & itchy nipples.  Contractions: Not present. Vag. Bleeding: None.  Movement: Present (Flutters). Denies leaking of fluid.   The following portions of the patient's history were reviewed and updated as appropriate: allergies, current medications, past family history, past medical history, past social history, past surgical history and problem list.   Objective:   Vitals:   12/18/23 0944  BP: 118/74  Pulse: 69  Weight: 174 lb 6.4 oz (79.1 kg)   Fetal Status: Fetal Heart Rate (bpm): 143   Movement: Present (Flutters)     General:  Alert, oriented and cooperative. Patient is in no acute distress.  Skin: Skin is warm and dry. No rash noted.   Cardiovascular: Normal heart rate noted  Respiratory: Normal respiratory effort, no problems with respiration noted  Abdomen: Soft, gravid, appropriate for gestational age.  Pain/Pressure: Absent      Assessment and Plan:  Pregnancy: [id number] at [redacted]w[redacted]d 1. Supervision of high risk pregnancy, antepartum 2. [redacted] weeks gestation of pregnancy (Primary) AFP negative 4/4 Anatomy scheduled 5/9 Discussed emollient on dry nipples  3. History of cesarean section G2 NRFHT (see below) - We discussed her history of c-section. Her previous c-section was due to  non-reassuring fetal heart tones.  She has a history of  no prior successful vaginal deliveries - We discussed the risks associated with repeat c-section: bleeding, infection, injury to surrounding organs/tissues I.e.  bowel/bladder, development of scar tissue, wound complications such as wound separation or infection, need for additional surgery, percreta/acreta - We discussed the risks associated with TOLAC: risk of it being unsuccessful, specially in the context of her history, the risks in general of a vaginal delivery (prolapse, SUI, differences in recovery, pelvic floor dysfunction, etc), and the risk of uterine rupture. We discussed with the risk of uterine rupture that while rare it is not easily predicted, that it is a surgical emergency, and it can be potentially catastrophic for mom and baby. We discussed if uterine rupture that it may necessitate hysterectomy if the rupture caused issues with bleeding that could not be managed with other surgical options.  - After counseling, the patient was given the opportunity to ask questions and all questions answered.  - After considering her options, she would like to [facility] - Information provided to the patient  4. History of preterm delivery 5. History of severe pre-eclampsia 7. History of fetal growth restriction -2022 G2 pregnancy - known FGR and presented for antenatal testing at 34 weeks. NST with minimal/absent variability and decelerations, and patient was also having severe range blood pressures. Cesarean delivery was recommended on arrival. Received mag x 24h PP -Normotensive -Normal baseline labs -Continue ldASA 162 -Anticipate serial growth US  this pregnancy . Please refer to After Visit Summary for other counseling recommendations.   Return in about 4 weeks (around 01/15/2024) for return OB at 23-24 weeks.  Future Appointments  Date Time Provider Department Center  12/25/2023  8:00 AM [facility] PROVIDER 1 [facility]  12/25/2023  8:30 AM [facility] US3 [facility]    Izell Marsh, MD

## 2023-12-22 ENCOUNTER — Other Ambulatory Visit: Payer: No Typology Code available for payment source

## 2023-12-22 ENCOUNTER — Ambulatory Visit: Payer: No Typology Code available for payment source

## 2023-12-25 ENCOUNTER — Other Ambulatory Visit: Payer: Self-pay | Admitting: *Deleted

## 2023-12-25 ENCOUNTER — Ambulatory Visit: Attending: Obstetrics and Gynecology | Admitting: Maternal & Fetal Medicine

## 2023-12-25 ENCOUNTER — Ambulatory Visit (HOSPITAL_BASED_OUTPATIENT_CLINIC_OR_DEPARTMENT_OTHER)

## 2023-12-25 VITALS — BP 114/61 | HR 74

## 2023-12-25 DIAGNOSIS — O365920 Maternal care for other known or suspected poor fetal growth, second trimester, not applicable or unspecified: Secondary | ICD-10-CM

## 2023-12-25 DIAGNOSIS — Z8751 Personal history of pre-term labor: Secondary | ICD-10-CM

## 2023-12-25 DIAGNOSIS — Z8759 Personal history of other complications of pregnancy, childbirth and the puerperium: Secondary | ICD-10-CM

## 2023-12-25 DIAGNOSIS — O34219 Maternal care for unspecified type scar from previous cesarean delivery: Secondary | ICD-10-CM | POA: Insufficient documentation

## 2023-12-25 DIAGNOSIS — O0990 Supervision of high risk pregnancy, unspecified, unspecified trimester: Secondary | ICD-10-CM

## 2023-12-25 DIAGNOSIS — O365990 Maternal care for other known or suspected poor fetal growth, unspecified trimester, not applicable or unspecified: Secondary | ICD-10-CM

## 2023-12-25 DIAGNOSIS — Z98891 History of uterine scar from previous surgery: Secondary | ICD-10-CM

## 2023-12-25 DIAGNOSIS — O09212 Supervision of pregnancy with history of pre-term labor, second trimester: Secondary | ICD-10-CM

## 2023-12-25 DIAGNOSIS — Z3A20 20 weeks gestation of pregnancy: Secondary | ICD-10-CM | POA: Insufficient documentation

## 2023-12-25 DIAGNOSIS — O09292 Supervision of pregnancy with other poor reproductive or obstetric history, second trimester: Secondary | ICD-10-CM | POA: Diagnosis not present

## 2023-12-25 NOTE — Progress Notes (Signed)
 Patient information  Patient Name: Stephanie [name]  Patient MRN:   [id number]  Referring practice: [facility] Referring Provider: [place] [facility]  MFM CONSULT  Stephanie [name] is a 28 y.o. [phone] at [redacted]w[redacted]d here for ultrasound and consultation. Patient Active Problem List   Diagnosis Date Noted   History of fetal growth restriction 12/18/2023   History of preterm delivery 11/29/2023   History of cesarean section 11/29/2023   Supervision of high risk pregnancy, antepartum 10/02/2023   History of severe pre-eclampsia 10/08/2020   Learning problem 12/31/2013    Stephanie [name] has a pregnancy with the complications mentioned in the problem list. During today's visit we focused on the following concerns:   RE hx of FGR, preE and CD: I discussed the previous pregnancy that was complicated by fetal growth restriction, preeclampsia and ended cesarean birth.  We discussed the potential risk for recurrence and the importance of 81 mg of aspirin  which she is compliant with.  She desires to attempt a vaginal birth.  Serial growth ultrasounds will be performed throughout the pregnancy.  Today in the estimated fetal weight is at the lower end of normal. Her LMP is known and her cycles regular.   Sonographic findings Single intrauterine pregnancy at 20w 3d. Fetal cardiac activity:  Observed and appears normal. Presentation: Transverse, head to maternal right. The anatomic structures that were well seen appear normal without evidence of soft markers. The anatomic survey is complete.  Fetal biometry shows the estimated fetal weight at the 15 percentile. Amniotic fluid: Within normal limits.  MVP: 4.19 cm. Placenta: Anterior. Adnexa: No abnormality visualized. Cervical length: 4.8 cm.  There are limitations of prenatal ultrasound such as the inability to detect certain abnormalities due to poor visualization. Various factors such as fetal position, gestational age and maternal body habitus may  increase the difficulty in visualizing the fetal anatomy.    Recommendations -EDD should be 05/10/2024 based on  LMP  (08/04/23). -Detailed ultrasound was done today without abnormalities. -Baseline preeclampsia labs: CMP, CBC and urine protein/creatinine ratio if not previously completed.  -Continue Aspirin  81 mg for preeclampsia prophylaxis -Follow-up anatomy and fetal growth in 3 weeks  -Serial growth ultrasounds starting around 28 weeks to monitor for fetal growth restriction -Delivery timing pending clinical course  -Continue routine prenatal care with referring OB provider  Review of Systems: A review of systems was performed and was negative except per HPI   Vitals and Physical Exam    12/25/2023    8:08 AM 12/18/2023    9:44 AM 11/29/2023    3:51 PM  Vitals with BMI  Weight  174 lbs 6 oz   Systolic 114 118 784  Diastolic 61 74 62  Pulse 74 69 64    Sitting comfortably on the sonogram table Nonlabored breathing Normal rate and rhythm Abdomen is nontender  Past pregnancies OB History  Gravida Para Term Preterm AB Living  3 1 0 1 1 1   SAB IAB Ectopic Multiple Live Births  1 0 0 0 1    # Outcome Date GA Lbr Len/2nd Weight Sex Type Anes PTL Lv  3 Current           2 Preterm 07/17/21 [redacted]w[redacted]d  3 lb 6 oz (1.53 kg) M CS-LTranv Spinal  LIV  1 SAB 09/2020            Obstetric Comments  2022 induced for  BP    I spent 30 minutes reviewing the patients chart, including labs and  images as well as counseling the patient about her medical conditions. Greater than 50% of the time was spent in direct face-to-face patient counseling.  Penney Bowling, DO Maternal fetal medicine, [place]   12/25/2023  9:47 AM

## 2024-01-13 ENCOUNTER — Encounter: Payer: Self-pay | Admitting: *Deleted

## 2024-01-13 DIAGNOSIS — O365930 Maternal care for other known or suspected poor fetal growth, third trimester, not applicable or unspecified: Secondary | ICD-10-CM | POA: Insufficient documentation

## 2024-01-13 DIAGNOSIS — O365920 Maternal care for other known or suspected poor fetal growth, second trimester, not applicable or unspecified: Secondary | ICD-10-CM | POA: Insufficient documentation

## 2024-01-15 ENCOUNTER — Other Ambulatory Visit: Payer: Self-pay | Admitting: *Deleted

## 2024-01-15 ENCOUNTER — Encounter: Payer: Self-pay | Admitting: Physician Assistant

## 2024-01-15 ENCOUNTER — Other Ambulatory Visit: Payer: Self-pay | Admitting: Maternal & Fetal Medicine

## 2024-01-15 ENCOUNTER — Ambulatory Visit (INDEPENDENT_AMBULATORY_CARE_PROVIDER_SITE_OTHER): Admitting: Physician Assistant

## 2024-01-15 ENCOUNTER — Ambulatory Visit: Attending: Obstetrics and Gynecology | Admitting: Obstetrics and Gynecology

## 2024-01-15 ENCOUNTER — Ambulatory Visit

## 2024-01-15 VITALS — BP 114/54 | HR 67

## 2024-01-15 VITALS — BP 107/69 | HR 68 | Wt 182.0 lb

## 2024-01-15 DIAGNOSIS — Z8759 Personal history of other complications of pregnancy, childbirth and the puerperium: Secondary | ICD-10-CM

## 2024-01-15 DIAGNOSIS — O09212 Supervision of pregnancy with history of pre-term labor, second trimester: Secondary | ICD-10-CM | POA: Insufficient documentation

## 2024-01-15 DIAGNOSIS — Z98891 History of uterine scar from previous surgery: Secondary | ICD-10-CM

## 2024-01-15 DIAGNOSIS — O365920 Maternal care for other known or suspected poor fetal growth, second trimester, not applicable or unspecified: Secondary | ICD-10-CM

## 2024-01-15 DIAGNOSIS — O365990 Maternal care for other known or suspected poor fetal growth, unspecified trimester, not applicable or unspecified: Secondary | ICD-10-CM

## 2024-01-15 DIAGNOSIS — Z8751 Personal history of pre-term labor: Secondary | ICD-10-CM | POA: Diagnosis not present

## 2024-01-15 DIAGNOSIS — O0990 Supervision of high risk pregnancy, unspecified, unspecified trimester: Secondary | ICD-10-CM

## 2024-01-15 DIAGNOSIS — O34219 Maternal care for unspecified type scar from previous cesarean delivery: Secondary | ICD-10-CM | POA: Diagnosis not present

## 2024-01-15 DIAGNOSIS — Z3A23 23 weeks gestation of pregnancy: Secondary | ICD-10-CM

## 2024-01-15 NOTE — Progress Notes (Signed)
 Maternal-Fetal Medicine Consultation Name: Stephanie [name] MRN: [id number]  G3 P1011 at 23w 3d gestation. Patient is here for fetal growth assessment. Obstetrical history significant for preterm cesarean delivery.  Her pregnancy was complicated by preeclampsia with severe features.  On cell-free fetal DNA screening, the risks of fetal aneuploidies are not increased.  Ultrasound On today's ultrasound, amniotic fluid is normal and good fetal activity seen.  The estimated fetal weight is at the 7 percentile and the abdominal circumference measurement is at the 8th percentile.  Umbilical artery Doppler showed normal forward diastolic flow.  Our concerns include: Fetal growth restriction I explained the finding of fetal growth restriction that is difficult to differentiate from a constitutionally small for gestational age fetus. I discussed the possible causes of fetal growth restriction including placental insufficiency (most common cause), chromosomal anomalies (more common in early-onset fetal growth restriction) and fetal infections.  Patient did not give history of fever or rashes. I explained that only amniocentesis will give a definitive result on the fetal karyotype and some genetic conditions (Microarray).  I explained amniocentesis procedure and possible complication of preterm delivery (1 and 500 procedures). Patient opted not to have amniocentesis. I discussed ultrasound protocol of monitoring fetal growth restriction. Timing of delivery: We will be discussing timing of delivery after future fetal growth assessments.  Previous cesarean delivery I counseled the patient on the benefits and risks of vaginal birth after cesarean delivery.  Trial of labor is associated with 1% risk of uterine scar dehiscence.  Repeat cesarean deliveries increase the risks of placenta previa and/or placenta accreta spectrum.  Recommendations - An appointment was made for her to return in 3 weeks for fetal  growth assessment and umbilical artery Doppler study.  Consultation including face-to-face (more than 50%) counseling 30 minutes.

## 2024-01-15 NOTE — Progress Notes (Signed)
 Pt presents for ROB visit. No concerns

## 2024-01-15 NOTE — Progress Notes (Signed)
   PRENATAL VISIT NOTE  Subjective:  Stephanie [name] is a 28 y.o. [phone] at [redacted]w[redacted]d being seen today for ongoing prenatal care.  She is currently monitored for the following issues for this high-risk pregnancy and has Learning problem; History of severe pre-eclampsia; Supervision of high risk pregnancy, antepartum; History of preterm delivery; History of cesarean section; History of fetal growth restriction; and IUGR (intrauterine growth restriction) affecting care of mother, second trimester, not applicable or unspecified fetus on their problem list.  Patient reports no complaints.  Contractions: Not present. Vag. Bleeding: None.  Movement: Present. Denies leaking of fluid.   The following portions of the patient's history were reviewed and updated as appropriate: allergies, current medications, past family history, past medical history, past social history, past surgical history and problem list.   Objective:    Vitals:   01/15/24 0837  BP: 107/69  Pulse: 68  Weight: 182 lb (82.6 kg)    Fetal Status:  Fetal Heart Rate (bpm): 149 Fundal Height: 23 cm Movement: Present    General: Alert, oriented and cooperative. Patient is in no acute distress.  Skin: Skin is warm and dry. No rash noted.   Cardiovascular: Normal heart rate noted  Respiratory: Normal respiratory effort, no problems with respiration noted  Abdomen: Soft, gravid, appropriate for gestational age.  Pain/Pressure: Absent     Pelvic: Cervical exam deferred        Extremities: Normal range of motion.  Edema: None  Mental Status: Normal mood and affect. Normal behavior. Normal judgment and thought content.   Assessment and Plan:  Pregnancy: [id number] at [redacted]w[redacted]d  1. Supervision of high risk pregnancy, antepartum (Primary) Patient doing well, feeling regular fetal movement  BP, FHR, FH appropriate   2. [redacted] weeks gestation of pregnancy Anticipatory guidance about next visits/weeks of pregnancy given.  Discussed fasting for  GTT/28w labs next visit  3. Poor fetal growth affecting management of mother in second trimester, single or unspecified fetus EFW lower end of normal (15%) by 12/25/23 US  F/u US  today  4. History of severe preeclampsia Continue ASA   Preterm labor symptoms and general obstetric precautions including but not limited to vaginal bleeding, contractions, leaking of fluid and fetal movement were reviewed in detail with the patient.  Please refer to After Visit Summary for other counseling recommendations.   Return in about 4 weeks (around 02/12/2024) for HOB, HOB+GTT.  Future Appointments  Date Time Provider Department Center  01/15/2024  1:00 PM [facility] PROVIDER 1 [facility]  01/15/2024  1:30 PM [facility] US5 [facility]    Luevenia Saha, PA-C

## 2024-01-15 NOTE — Patient Instructions (Signed)
 Arrive to your next appointment fasting (no food or drink by mouth) starting from the midnight before to prepare for your glucose test.

## 2024-01-16 LAB — PROTEIN / CREATININE RATIO, URINE
Creatinine, Urine: 57.8 mg/dL
Protein, Ur: 10.6 mg/dL
Protein/Creat Ratio: 183 mg/g{creat} (ref 0–200)

## 2024-01-18 ENCOUNTER — Ambulatory Visit (HOSPITAL_COMMUNITY): Payer: Self-pay | Admitting: Physician Assistant

## 2024-01-22 ENCOUNTER — Other Ambulatory Visit

## 2024-01-23 LAB — COMPREHENSIVE METABOLIC PANEL WITH GFR
ALT: 18 IU/L (ref 0–32)
AST: 13 IU/L (ref 0–40)
Albumin: 3.8 g/dL — ABNORMAL LOW (ref 4.0–5.0)
Alkaline Phosphatase: 111 IU/L (ref 44–121)
BUN/Creatinine Ratio: 12 (ref 9–23)
BUN: 7 mg/dL (ref 6–20)
Bilirubin Total: 0.4 mg/dL (ref 0.0–1.2)
CO2: 18 mmol/L — ABNORMAL LOW (ref 20–29)
Calcium: 8.7 mg/dL (ref 8.7–10.2)
Chloride: 105 mmol/L (ref 96–106)
Creatinine, Ser: 0.57 mg/dL (ref 0.57–1.00)
Globulin, Total: 2.3 g/dL (ref 1.5–4.5)
Glucose: 95 mg/dL (ref 70–99)
Potassium: 4 mmol/L (ref 3.5–5.2)
Sodium: 138 mmol/L (ref 134–144)
Total Protein: 6.1 g/dL (ref 6.0–8.5)
eGFR: 126 mL/min/{1.73_m2} (ref >59)

## 2024-02-03 ENCOUNTER — Other Ambulatory Visit: Payer: Self-pay | Admitting: *Deleted

## 2024-02-03 ENCOUNTER — Ambulatory Visit: Attending: Obstetrics and Gynecology | Admitting: Obstetrics and Gynecology

## 2024-02-03 ENCOUNTER — Ambulatory Visit

## 2024-02-03 VITALS — BP 109/64 | HR 75

## 2024-02-03 DIAGNOSIS — O365920 Maternal care for other known or suspected poor fetal growth, second trimester, not applicable or unspecified: Secondary | ICD-10-CM | POA: Insufficient documentation

## 2024-02-03 DIAGNOSIS — Z3A26 26 weeks gestation of pregnancy: Secondary | ICD-10-CM

## 2024-02-03 DIAGNOSIS — Z98891 History of uterine scar from previous surgery: Secondary | ICD-10-CM

## 2024-02-03 DIAGNOSIS — O0990 Supervision of high risk pregnancy, unspecified, unspecified trimester: Secondary | ICD-10-CM

## 2024-02-03 DIAGNOSIS — O09292 Supervision of pregnancy with other poor reproductive or obstetric history, second trimester: Secondary | ICD-10-CM | POA: Insufficient documentation

## 2024-02-03 DIAGNOSIS — O09212 Supervision of pregnancy with history of pre-term labor, second trimester: Secondary | ICD-10-CM | POA: Diagnosis not present

## 2024-02-03 DIAGNOSIS — O34219 Maternal care for unspecified type scar from previous cesarean delivery: Secondary | ICD-10-CM | POA: Insufficient documentation

## 2024-02-03 DIAGNOSIS — Z8751 Personal history of pre-term labor: Secondary | ICD-10-CM

## 2024-02-03 DIAGNOSIS — Z8759 Personal history of other complications of pregnancy, childbirth and the puerperium: Secondary | ICD-10-CM

## 2024-02-03 NOTE — Progress Notes (Signed)
 Maternal-Fetal Medicine Consultation Name: Stephanie [name] MRN: [id number]  G3 P1011 at 26w 1d gestation.  Fetal growth restriction.  On ultrasound performed 3 weeks ago, the estimated fetal weight was at the 7 percentile and the abdominal circumference measurement at the 8th percentile. Her pregnancy is well dated by sure LMP consistent with 7-week ultrasound dating.  Her blood pressure today at our office is 109/64 mmHg. Obstetrical history significant for a term cesarean delivery.  Ultrasound On today's ultrasound, amniotic fluid is normal good fetal activity seen.  The estimated fetal weight is at the 2nd percentile and the abdominal circumference measurement is at the 5th percentile.  Interval weight gain is 201 g.  Umbilical artery Doppler showed normal forward diastolic flow.  I explained the finding of severe fetal growth restriction defined by estimated fetal weight at less than the 3rd percentile.  Interval weight gain is suboptimal.  Discussed the importance of Doppler studies and antenatal testing. Timing of delivery will be based on future fetal growth assessments.  Recommendations - NST and UA Doppler in 2 weeks. - Fetal growth assessment in 3 weeks. Consultation including face-to-face (more than 50%) counseling 10 minutes.

## 2024-02-12 ENCOUNTER — Other Ambulatory Visit

## 2024-02-12 ENCOUNTER — Ambulatory Visit (INDEPENDENT_AMBULATORY_CARE_PROVIDER_SITE_OTHER): Payer: Self-pay | Admitting: Physician Assistant

## 2024-02-12 VITALS — BP 118/69 | HR 74 | Wt 187.1 lb

## 2024-02-12 DIAGNOSIS — O0990 Supervision of high risk pregnancy, unspecified, unspecified trimester: Secondary | ICD-10-CM

## 2024-02-12 DIAGNOSIS — Z3A27 27 weeks gestation of pregnancy: Secondary | ICD-10-CM | POA: Diagnosis not present

## 2024-02-12 DIAGNOSIS — Z8759 Personal history of other complications of pregnancy, childbirth and the puerperium: Secondary | ICD-10-CM | POA: Diagnosis not present

## 2024-02-12 DIAGNOSIS — O365990 Maternal care for other known or suspected poor fetal growth, unspecified trimester, not applicable or unspecified: Secondary | ICD-10-CM | POA: Diagnosis not present

## 2024-02-12 NOTE — Progress Notes (Signed)
   PRENATAL VISIT NOTE  Subjective:  Stephanie [name] is a 28 y.o. [phone] at [redacted]w[redacted]d being seen today for ongoing prenatal care.  She is currently monitored for the following issues for this high-risk pregnancy and has Learning problem; History of severe pre-eclampsia; Supervision of high risk pregnancy, antepartum; History of preterm delivery; History of cesarean section; History of fetal growth restriction; and IUGR (intrauterine growth restriction) affecting care of mother, second trimester, not applicable or unspecified fetus on their problem list.  Patient reports no complaints.  Contractions: Not present. Vag. Bleeding: None.  Movement: Present. Denies leaking of fluid.   The following portions of the patient's history were reviewed and updated as appropriate: allergies, current medications, past family history, past medical history, past social history, past surgical history and problem list.   Objective:    Vitals:   02/12/24 0837  BP: 118/69  Pulse: 74  Weight: 187 lb 1.6 oz (84.9 kg)    Fetal Status:  Fetal Heart Rate (bpm): 145 Fundal Height: 27 cm Movement: Present    General: Alert, oriented and cooperative. Patient is in no acute distress.  Skin: Skin is warm and dry. No rash noted.   Cardiovascular: Normal heart rate noted  Respiratory: Normal respiratory effort, no problems with respiration noted  Abdomen: Soft, gravid, appropriate for gestational age.  Pain/Pressure: Absent     Pelvic: Cervical exam deferred        Extremities: Normal range of motion.  Edema: None  Mental Status: Normal mood and affect. Normal behavior. Normal judgment and thought content.   Assessment and Plan:  Pregnancy: [id number] at [redacted]w[redacted]d  1. Supervision of high risk pregnancy, antepartum (Primary) Patient doing well, feeling regular fetal movement  BP, FHR, FH appropriate  2. [redacted] weeks gestation of pregnancy Anticipatory guidance about next visits/weeks of pregnancy given.   3. Poor fetal  growth affecting management of mother, antepartum, single or unspecified fetus 02/03/24 EFW 2.2%, AFI and UAD WNL  4. History of severe pre-eclampsia Normotensive Baseline labs normal Continue ASA  Preterm labor symptoms and general obstetric precautions including but not limited to vaginal bleeding, contractions, leaking of fluid and fetal movement were reviewed in detail with the patient.  Please refer to After Visit Summary for other counseling recommendations.   Return in about 2 weeks (around 02/26/2024) for [facility].  Future Appointments  Date Time Provider Department Center  02/12/2024 10:55 AM Nicholaus Jorene BRAVO, PA-C [facility] None  03/11/2024  7:00 AM [facility] PROVIDER 1 [facility]  03/11/2024  7:30 AM [facility] US2 [facility]  03/11/2024  8:45 AM [facility] NST [facility]  03/18/2024 11:00 AM [facility] PROVIDER 1 [facility]  03/18/2024 11:30 AM [facility] US3 [facility]  03/18/2024  1:15 PM [facility] NST [facility]    Jorene BRAVO Nicholaus, PA-C

## 2024-02-12 NOTE — Progress Notes (Signed)
 Pt reports fetal movement, denies pain. Provided VIS for tdap

## 2024-02-13 ENCOUNTER — Ambulatory Visit: Payer: Self-pay | Admitting: Physician Assistant

## 2024-02-13 LAB — GLUCOSE TOLERANCE, 2 HOURS W/ 1HR
Glucose, 1 hour: 112 mg/dL (ref 70–179)
Glucose, 2 hour: 109 mg/dL (ref 70–152)
Glucose, Fasting: 90 mg/dL (ref 70–91)

## 2024-02-13 LAB — RPR: RPR Ser Ql: NONREACTIVE

## 2024-02-13 LAB — CBC
Hematocrit: 40.9 % (ref 34.0–46.6)
Hemoglobin: 13.1 g/dL (ref 11.1–15.9)
MCH: 30 pg (ref 26.6–33.0)
MCHC: 32 g/dL (ref 31.5–35.7)
MCV: 94 fL (ref 79–97)
Platelets: 318 10*3/uL (ref 150–450)
RBC: 4.36 x10E6/uL (ref 3.77–5.28)
RDW: 12.8 % (ref 11.7–15.4)
WBC: 9 10*3/uL (ref 3.4–10.8)

## 2024-02-13 LAB — HIV ANTIBODY (ROUTINE TESTING W REFLEX): HIV Screen 4th Generation wRfx: NONREACTIVE

## 2024-02-22 ENCOUNTER — Ambulatory Visit (HOSPITAL_BASED_OUTPATIENT_CLINIC_OR_DEPARTMENT_OTHER)

## 2024-02-22 ENCOUNTER — Ambulatory Visit (HOSPITAL_BASED_OUTPATIENT_CLINIC_OR_DEPARTMENT_OTHER): Admitting: *Deleted

## 2024-02-22 ENCOUNTER — Ambulatory Visit (HOSPITAL_BASED_OUTPATIENT_CLINIC_OR_DEPARTMENT_OTHER): Attending: Obstetrics and Gynecology | Admitting: Obstetrics and Gynecology

## 2024-02-22 VITALS — BP 116/65 | HR 89

## 2024-02-22 DIAGNOSIS — O365930 Maternal care for other known or suspected poor fetal growth, third trimester, not applicable or unspecified: Secondary | ICD-10-CM | POA: Diagnosis present

## 2024-02-22 DIAGNOSIS — O0990 Supervision of high risk pregnancy, unspecified, unspecified trimester: Secondary | ICD-10-CM

## 2024-02-22 DIAGNOSIS — O09213 Supervision of pregnancy with history of pre-term labor, third trimester: Secondary | ICD-10-CM | POA: Insufficient documentation

## 2024-02-22 DIAGNOSIS — Z8759 Personal history of other complications of pregnancy, childbirth and the puerperium: Secondary | ICD-10-CM

## 2024-02-22 DIAGNOSIS — Z98891 History of uterine scar from previous surgery: Secondary | ICD-10-CM

## 2024-02-22 DIAGNOSIS — O34219 Maternal care for unspecified type scar from previous cesarean delivery: Secondary | ICD-10-CM

## 2024-02-22 DIAGNOSIS — O09293 Supervision of pregnancy with other poor reproductive or obstetric history, third trimester: Secondary | ICD-10-CM | POA: Insufficient documentation

## 2024-02-22 DIAGNOSIS — Z3A28 28 weeks gestation of pregnancy: Secondary | ICD-10-CM | POA: Diagnosis not present

## 2024-02-22 DIAGNOSIS — Z8751 Personal history of pre-term labor: Secondary | ICD-10-CM

## 2024-02-22 DIAGNOSIS — Z362 Encounter for other antenatal screening follow-up: Secondary | ICD-10-CM | POA: Insufficient documentation

## 2024-02-22 DIAGNOSIS — O365920 Maternal care for other known or suspected poor fetal growth, second trimester, not applicable or unspecified: Secondary | ICD-10-CM

## 2024-02-22 NOTE — Procedures (Signed)
 Stephanie [name] 11-20-1995 [redacted]w[redacted]d  Fetus A Non-Stress Test Interpretation for 02/22/24  Indication: IUGR  Fetal Heart Rate A Mode: External Baseline Rate (A): 145 bpm Variability: Moderate Accelerations: 10 x 10 Decelerations: None Multiple birth?: No  Uterine Activity Mode: Palpation, Toco Contraction Frequency (min): None Resting Tone Palpated: Relaxed Resting Time: Adequate  Interpretation (Fetal Testing) Nonstress Test Interpretation: Reactive Overall Impression: Reassuring for gestational age Comments: Dr. Arna reviewed tracing.

## 2024-02-22 NOTE — Progress Notes (Signed)
 Maternal-Fetal Medicine Consultation Name: Stephanie [name] MRN: [id number]  G3 P1011 at 28w 6d gestation. Patient is here for fetal growth assessment.  On ultrasound performed 3 weeks ago, the estimated fetal weight was at the 2nd percentile and the abdominal circumference measurement at the 5th percentile. Blood pressure today at our office is 116/65 mmHg.  Ultrasound On today's ultrasound, the estimated fetal weight is at the 5th percentile and the abdominal circumference measurement at the 7th percentile.  Interval weight gain over 3 weeks is 379 g.  Amniotic fluid is normal and good fetal activity seen.  Umbilical artery Doppler showed normal forward diastolic flow.  NST is reactive.  I reassured the patient of the findings and adequate interval growth.  I discussed the importance of antenatal testing.  Patient asked about timing of delivery.  I reassured the patient that we will recommend timing of delivery based on future fetal growth assessments.  If severe fetal growth restriction persists, we will recommend delivery at [redacted] weeks gestation.   Recommendations - Patient has appointments for antenatal testing.   Consultation including face-to-face (more than 50%) counseling 10 minutes.

## 2024-02-26 ENCOUNTER — Encounter: Admitting: Obstetrics & Gynecology

## 2024-03-02 ENCOUNTER — Other Ambulatory Visit

## 2024-03-03 ENCOUNTER — Ambulatory Visit: Admitting: *Deleted

## 2024-03-03 ENCOUNTER — Ambulatory Visit: Attending: Obstetrics and Gynecology | Admitting: Maternal & Fetal Medicine

## 2024-03-03 ENCOUNTER — Ambulatory Visit

## 2024-03-03 VITALS — BP 124/62 | HR 87

## 2024-03-03 DIAGNOSIS — Z3A30 30 weeks gestation of pregnancy: Secondary | ICD-10-CM

## 2024-03-03 DIAGNOSIS — Z8751 Personal history of pre-term labor: Secondary | ICD-10-CM

## 2024-03-03 DIAGNOSIS — Z98891 History of uterine scar from previous surgery: Secondary | ICD-10-CM

## 2024-03-03 DIAGNOSIS — O09213 Supervision of pregnancy with history of pre-term labor, third trimester: Secondary | ICD-10-CM | POA: Insufficient documentation

## 2024-03-03 DIAGNOSIS — O365920 Maternal care for other known or suspected poor fetal growth, second trimester, not applicable or unspecified: Secondary | ICD-10-CM

## 2024-03-03 DIAGNOSIS — O0990 Supervision of high risk pregnancy, unspecified, unspecified trimester: Secondary | ICD-10-CM

## 2024-03-03 DIAGNOSIS — O365930 Maternal care for other known or suspected poor fetal growth, third trimester, not applicable or unspecified: Secondary | ICD-10-CM

## 2024-03-03 DIAGNOSIS — Z8759 Personal history of other complications of pregnancy, childbirth and the puerperium: Secondary | ICD-10-CM

## 2024-03-03 DIAGNOSIS — O34219 Maternal care for unspecified type scar from previous cesarean delivery: Secondary | ICD-10-CM | POA: Insufficient documentation

## 2024-03-03 NOTE — Progress Notes (Signed)
 After review, MFM consult with provider is not indicated for today  Stephanie Nathanel Pipe, MD 03/03/2024 5:44 PM  [facility]

## 2024-03-03 NOTE — Procedures (Signed)
 Daine Pohlman 07-Jul-1996 [redacted]w[redacted]d  Fetus A Non-Stress Test Interpretation for 03/03/24  Indication: IUGR  Fetal Heart Rate A Mode: External Baseline Rate (A): 140 bpm Variability: Moderate Accelerations: 10 x 10 Decelerations: None Multiple birth?: No  Uterine Activity Mode: Palpation, Toco Contraction Frequency (min): none Resting Tone Palpated: Relaxed  Interpretation (Fetal Testing) Nonstress Test Interpretation: Reactive Overall Impression: Reassuring for gestational age Comments: Dr. Kizzie reviewed tracing

## 2024-03-04 ENCOUNTER — Ambulatory Visit: Admitting: Certified Nurse Midwife

## 2024-03-04 DIAGNOSIS — Z98891 History of uterine scar from previous surgery: Secondary | ICD-10-CM

## 2024-03-04 DIAGNOSIS — O0993 Supervision of high risk pregnancy, unspecified, third trimester: Secondary | ICD-10-CM

## 2024-03-04 DIAGNOSIS — Z3A30 30 weeks gestation of pregnancy: Secondary | ICD-10-CM

## 2024-03-04 DIAGNOSIS — Z8759 Personal history of other complications of pregnancy, childbirth and the puerperium: Secondary | ICD-10-CM

## 2024-03-11 ENCOUNTER — Ambulatory Visit: Admitting: *Deleted

## 2024-03-11 ENCOUNTER — Ambulatory Visit

## 2024-03-11 ENCOUNTER — Ambulatory Visit: Attending: Obstetrics and Gynecology | Admitting: Maternal & Fetal Medicine

## 2024-03-11 VITALS — BP 116/64 | HR 84

## 2024-03-11 DIAGNOSIS — O365930 Maternal care for other known or suspected poor fetal growth, third trimester, not applicable or unspecified: Secondary | ICD-10-CM | POA: Diagnosis present

## 2024-03-11 DIAGNOSIS — O34219 Maternal care for unspecified type scar from previous cesarean delivery: Secondary | ICD-10-CM | POA: Diagnosis not present

## 2024-03-11 DIAGNOSIS — Z98891 History of uterine scar from previous surgery: Secondary | ICD-10-CM

## 2024-03-11 DIAGNOSIS — Z3A31 31 weeks gestation of pregnancy: Secondary | ICD-10-CM | POA: Diagnosis not present

## 2024-03-11 DIAGNOSIS — O0990 Supervision of high risk pregnancy, unspecified, unspecified trimester: Secondary | ICD-10-CM

## 2024-03-11 DIAGNOSIS — O365920 Maternal care for other known or suspected poor fetal growth, second trimester, not applicable or unspecified: Secondary | ICD-10-CM

## 2024-03-11 DIAGNOSIS — O09213 Supervision of pregnancy with history of pre-term labor, third trimester: Secondary | ICD-10-CM | POA: Insufficient documentation

## 2024-03-11 DIAGNOSIS — Z8751 Personal history of pre-term labor: Secondary | ICD-10-CM

## 2024-03-11 DIAGNOSIS — Z8759 Personal history of other complications of pregnancy, childbirth and the puerperium: Secondary | ICD-10-CM

## 2024-03-11 NOTE — Progress Notes (Signed)
 Patient information  Patient Name: Stephanie [name]  Patient MRN:   [id number]  Referring practice: MFM Referring Provider: [facility]  Problem List   Patient Active Problem List   Diagnosis Date Noted   IUGR (intrauterine growth restriction) affecting care of mother, second trimester, not applicable or unspecified fetus 01/13/2024   History of fetal growth restriction 12/18/2023   History of preterm delivery 11/29/2023   History of cesarean section 11/29/2023   Supervision of high risk pregnancy, antepartum 10/02/2023   History of severe pre-eclampsia 10/08/2020   Learning problem 12/31/2013    Maternal Fetal medicine Consult  Ernie CALVARIO is a 28 y.o. [id number] at [redacted]w[redacted]d here for ultrasound and consultation. Kerah Davanzo is doing well today with no acute concerns. Today we focused on the following:   Fetal growth restriction: On 7/17 the estimated fetal weight was at the 5th percentile overall with the abdominal circumference at the 7th percentile.  Umbilical artery Dopplers were normal.  Today the Dopplers were also normal fluid based on patient.  NST was reactive.  She will continue to follow-up.  Delivery timing based on future ultrasound findings. The patient had time to ask questions that were answered to her satisfaction.  She verbalized understanding and agrees to proceed with the plan below.  Sonographic findings Single intrauterine pregnancy at 31w 3d. Fetal cardiac activity:  Observed and appears normal. Presentation: Cephalic. Interval fetal anatomy appears normal. Amniotic fluid: Within normal limits.  MVP: 6.04 cm. Placenta: Anterior. Umbilical artery dopplers findings: -S/D:2.58 which are normal at this gestational age.  -Absent end-diastolic flow: No.  -Reversed end-diastolic flow: No. NST: reactive.   There are limitations of prenatal ultrasound such as the inability to detect certain abnormalities due to poor visualization. Various factors such as  fetal position, gestational age and maternal body habitus may increase the difficulty in visualizing the fetal anatomy.    Recommendations - Continue weekly UA dopplers  until delivery. - Twice weekly antenatal testing may be indicated if there is very poor fetal growth, if the UA Dopplers have peroids of absent end diastolic flow or if the antenatal testing is equivocal.  - Betamethasone  may be indicated if there is concern for needing to deliver within 7 days.  At this time there is no indication present. - Serial growth US  every 3 weeks until delivery  - Delivery timing will be pending clinical course but likely around 38 weeks  Review of Systems: A review of systems was performed and was negative except per HPI   Vitals and Physical Exam    03/11/2024    7:08 AM 03/03/2024    2:57 PM 02/22/2024    8:14 AM  Vitals with BMI  Systolic 116 124 883  Diastolic 64 62 65  Pulse 84 87 89    Sitting comfortably on the sonogram table Nonlabored breathing Normal rate and rhythm Abdomen is nontender  Past pregnancies OB History  Gravida Para Term Preterm AB Living  3 1 0 1 1 1   SAB IAB Ectopic Multiple Live Births  1 0 0 0 1    # Outcome Date GA Lbr Len/2nd Weight Sex Type Anes PTL Lv  3 Current           2 Preterm 07/17/21 [redacted]w[redacted]d  3 lb 6 oz (1.53 kg) M CS-LTranv Spinal  LIV  1 SAB 09/2020            Obstetric Comments  2022 induced for  BP  I spent 20 minutes reviewing the patients chart, including labs and images as well as counseling the patient about her medical conditions. Greater than 50% of the time was spent in direct face-to-face patient counseling.  Delora Smaller  MFM, [facility]   03/11/2024  8:14 AM

## 2024-03-18 ENCOUNTER — Ambulatory Visit: Admitting: Family Medicine

## 2024-03-18 ENCOUNTER — Ambulatory Visit

## 2024-03-18 ENCOUNTER — Ambulatory Visit: Attending: Obstetrics and Gynecology | Admitting: Obstetrics and Gynecology

## 2024-03-18 ENCOUNTER — Other Ambulatory Visit: Payer: Self-pay | Admitting: *Deleted

## 2024-03-18 VITALS — BP 118/62 | HR 80

## 2024-03-18 VITALS — BP 120/77 | HR 93 | Wt 194.4 lb

## 2024-03-18 DIAGNOSIS — O0993 Supervision of high risk pregnancy, unspecified, third trimester: Secondary | ICD-10-CM

## 2024-03-18 DIAGNOSIS — O365920 Maternal care for other known or suspected poor fetal growth, second trimester, not applicable or unspecified: Secondary | ICD-10-CM

## 2024-03-18 DIAGNOSIS — O09293 Supervision of pregnancy with other poor reproductive or obstetric history, third trimester: Secondary | ICD-10-CM | POA: Insufficient documentation

## 2024-03-18 DIAGNOSIS — O365930 Maternal care for other known or suspected poor fetal growth, third trimester, not applicable or unspecified: Secondary | ICD-10-CM | POA: Diagnosis not present

## 2024-03-18 DIAGNOSIS — O34219 Maternal care for unspecified type scar from previous cesarean delivery: Secondary | ICD-10-CM | POA: Diagnosis not present

## 2024-03-18 DIAGNOSIS — Z3A32 32 weeks gestation of pregnancy: Secondary | ICD-10-CM

## 2024-03-18 DIAGNOSIS — O09213 Supervision of pregnancy with history of pre-term labor, third trimester: Secondary | ICD-10-CM | POA: Insufficient documentation

## 2024-03-18 DIAGNOSIS — Z8751 Personal history of pre-term labor: Secondary | ICD-10-CM

## 2024-03-18 DIAGNOSIS — Z8759 Personal history of other complications of pregnancy, childbirth and the puerperium: Secondary | ICD-10-CM | POA: Diagnosis present

## 2024-03-18 DIAGNOSIS — Z98891 History of uterine scar from previous surgery: Secondary | ICD-10-CM

## 2024-03-18 DIAGNOSIS — O0990 Supervision of high risk pregnancy, unspecified, unspecified trimester: Secondary | ICD-10-CM

## 2024-03-18 NOTE — Progress Notes (Signed)
 Maternal-Fetal Medicine Consultation Name: Harjot Zavadil MRN: [id number]  G3 P1011 at 32w 3d gestation. Fetal growth restriction.  On ultrasound performed 3 to 4 weeks ago, the estimated fetal weight was at the 5th percentile and the abdominal circumference measurement at the 7 percentile. Blood pressure today at our office is 118/62 mmHg.  Ultrasound On today's ultrasound, estimated fetal weight is at the 5th percentile and the abdominal circumference measurement at the 4th percentile.  Interval weight gain is 532 g.  Amniotic fluid is normal and good fetal activity seen.  Cephalic presentation.  Umbilical artery Doppler showed normal forward diastolic flow.  BPP 8/8.  I reassured the patient of adequate interval growth.  I explained the significance of antenatal testing that has limitations in predicting fetal compromise. Will discuss timing of delivery after next fetal growth assessment.   Recommendations - Continue weekly antenatal testing till delivery.    Consultation including face-to-face (more than 50%) counseling 10 minutes.

## 2024-03-18 NOTE — Progress Notes (Signed)
 Pt presents for HOB. No questions or concerns.

## 2024-03-18 NOTE — Progress Notes (Signed)
   PRENATAL VISIT NOTE  Subjective:  Stephanie [name] is a 28 y.o. [phone] at [redacted]w[redacted]d being seen today for ongoing prenatal care.  She is currently monitored for the following issues for this high-risk pregnancy and has Learning problem; History of severe pre-eclampsia; Supervision of high risk pregnancy, antepartum; History of preterm delivery; History of cesarean section; History of fetal growth restriction; and IUGR (intrauterine growth restriction) affecting care of mother, second trimester, not applicable or unspecified fetus on their problem list.  Patient doing well with no acute concerns today. She reports some subjective SOB with cleaning and some occasional dizziness when laying on couch.  Denies CP, N/V, arm pain, palpitations, or presycope.  Contractions: Not present. Vag. Bleeding: None.  Movement: Present. Denies leaking of fluid.   The following portions of the patient's history were reviewed and updated as appropriate: allergies, current medications, past family history, past medical history, past social history, past surgical history and problem list. Problem list updated.  Objective:   Vitals:   03/18/24 0827  BP: 120/77  Pulse: 93  Weight: 194 lb 6.4 oz (88.2 kg)    Fetal Status: Fetal Heart Rate (bpm): 141 Fundal Height: 30 cm Movement: Present     General:  Alert, oriented and cooperative. Patient is in no acute distress.  Skin: Skin is warm and dry. No rash noted.   Cardiovascular: Normal heart rate noted  Respiratory: Normal respiratory effort, no problems with respiration noted  Abdomen: Soft, gravid, appropriate for gestational age.  Pain/Pressure: Absent     Pelvic: Cervical exam deferred        Extremities: Normal range of motion.  Edema: None  Mental Status:  Normal mood and affect. Normal behavior. Normal judgment and thought content.   Assessment and Plan:  Pregnancy: [id number] at [redacted]w[redacted]d  1. [redacted] weeks gestation of pregnancy (Primary) 2. Supervision of high  risk pregnancy in third trimester Encouraged increased water  intake and reviewed MAU if worrisome signs / symptoms   3. Poor fetal growth affecting management of mother in third trimester, single or unspecified fetus EFW 5%, AC 7%, nrl UA dopplers, no reverse or absent flow, BPP 8/8  Weekly UA Dopplers (US  today 1130) Twice weekly NST (appt 1315 today)  4. History of cesarean section 5. History of preterm delivery G2, antenatal testing at 34wk minimal/absent variability and decelerations, CS recommended  6. History of pre-eclampsia 34 wks with severe range Bps  Normotensive, nrl baseline PEC labs Continue ASA 162  Preterm labor symptoms and general obstetric precautions including but not limited to vaginal bleeding, contractions, leaking of fluid and fetal movement were reviewed in detail with the patient.  Please refer to After Visit Summary for other counseling recommendations.   Return in about 2 weeks (around 04/01/2024) for [facility].   Augustin Slade, MD Family Medicine - Obstetrics Fellow

## 2024-03-25 ENCOUNTER — Ambulatory Visit

## 2024-03-30 ENCOUNTER — Encounter: Payer: Self-pay | Admitting: Obstetrics and Gynecology

## 2024-03-31 ENCOUNTER — Ambulatory Visit (INDEPENDENT_AMBULATORY_CARE_PROVIDER_SITE_OTHER): Admitting: Obstetrics and Gynecology

## 2024-03-31 VITALS — BP 107/69 | HR 90 | Wt 200.0 lb

## 2024-03-31 DIAGNOSIS — O0990 Supervision of high risk pregnancy, unspecified, unspecified trimester: Secondary | ICD-10-CM

## 2024-03-31 DIAGNOSIS — Z3A34 34 weeks gestation of pregnancy: Secondary | ICD-10-CM | POA: Diagnosis not present

## 2024-03-31 DIAGNOSIS — O34219 Maternal care for unspecified type scar from previous cesarean delivery: Secondary | ICD-10-CM

## 2024-03-31 DIAGNOSIS — Z23 Encounter for immunization: Secondary | ICD-10-CM

## 2024-03-31 DIAGNOSIS — R0602 Shortness of breath: Secondary | ICD-10-CM

## 2024-03-31 DIAGNOSIS — O365930 Maternal care for other known or suspected poor fetal growth, third trimester, not applicable or unspecified: Secondary | ICD-10-CM | POA: Diagnosis not present

## 2024-03-31 DIAGNOSIS — Z8759 Personal history of other complications of pregnancy, childbirth and the puerperium: Secondary | ICD-10-CM

## 2024-03-31 DIAGNOSIS — O26899 Other specified pregnancy related conditions, unspecified trimester: Secondary | ICD-10-CM

## 2024-03-31 NOTE — Progress Notes (Signed)
   PRENATAL VISIT NOTE  Subjective:  Stephanie [name] is a 28 y.o. [phone] at [redacted]w[redacted]d being seen today for ongoing prenatal care.  She is currently monitored for the following issues for this high-risk pregnancy and has Learning problem; History of severe pre-eclampsia; Supervision of high risk pregnancy, antepartum; History of preterm delivery; History of cesarean delivery, antepartum; History of fetal growth restriction; and Poor fetal growth affecting management of mother in third trimester on their problem list.  Patient reports feeling out of breath with increased activity like cleaning and sometimes when she is laying down on her left side. Symptoms only last a few minutes then go away with position changes or rest. No chest pain. No hx asthma. Reports she thinks she had similar symptoms in her last pregnancy.  Contractions: Not present. Vag. Bleeding: None.  Movement: Present. Denies leaking of fluid.   The following portions of the patient's history were reviewed and updated as appropriate: allergies, current medications, past family history, past medical history, past social history, past surgical history and problem list.   Objective:   Vitals:   03/31/24 0905  BP: 107/69  Pulse: 90  Weight: 200 lb (90.7 kg)  O2 saturation 97% Body mass index is 35.43 kg/m. Total weight gain: 39 lb (17.7 kg)   Fetal Status: Fetal Heart Rate (bpm): 141 Fundal Height: 32 cm Movement: Present     General:  Alert, oriented and cooperative. Patient is in no acute distress.  Skin: Skin is warm and dry. No rash noted.   Cardiovascular: Normal heart rate noted. RRR  Respiratory: Normal respiratory effort, no problems with respiration noted. CTAB  Abdomen: Soft, gravid, appropriate for gestational age.  Pain/Pressure: Absent     Pelvic: Cervical exam deferred        Extremities: Normal range of motion.  Edema: None  Mental Status: Normal mood and affect. Normal behavior. Normal judgment and thought  content.   Assessment and Plan:  Pregnancy: [id number] at [redacted]w[redacted]d 1. Supervision of high risk pregnancy, antepartum (Primary) Anticipatory guidance - Tdap vaccine greater than or equal to 7yo IM  2. Poor fetal growth affecting management of mother in third trimester, single or unspecified fetus Last growth 8/1 with EFW 5%, AC 7%, BPP 8/8, normal dopplers Weekly antenatal testing until delivery Reports excellent fetal movement  3. History of cesarean delivery, antepartum Plans TOLAC, consent signed 12/18/23  4. History of severe pre-eclampsia Continue aspirin , BP appropriate today  5. [redacted] weeks gestation of pregnancy - Tdap vaccine greater than or equal to 7yo IM  6. Shortness of breath due to pregnancy Suspect symptoms are due to third trimester of pregnancy given that they are transient and exacerbated by increased activity and certain positions and resolve with rest and position changes. Normal O2 saturation. Reviewed warning signs to seek emergency care for new, worsening symptoms  Preterm labor symptoms and general obstetric precautions including but not limited to vaginal bleeding, contractions, leaking of fluid and fetal movement were reviewed in detail with the patient. Please refer to After Visit Summary for other counseling recommendations.   Return in about 2 weeks (around 04/14/2024).  Future Appointments  Date Time Provider Department Center  04/01/2024  8:15 AM [facility] PROVIDER 1 [facility]  04/01/2024  8:30 AM [facility] US6 [facility]  04/15/2024  3:15 PM [facility] PROVIDER 1 [facility]  04/15/2024  3:30 PM [facility] US4 [facility]    Rollo ONEIDA Bring, MD

## 2024-03-31 NOTE — Progress Notes (Signed)
 ROB, reports no concerns today. TDAP given in LD, tolerated well.

## 2024-04-01 ENCOUNTER — Ambulatory Visit

## 2024-04-01 ENCOUNTER — Ambulatory Visit: Attending: Obstetrics and Gynecology

## 2024-04-01 ENCOUNTER — Ambulatory Visit (HOSPITAL_BASED_OUTPATIENT_CLINIC_OR_DEPARTMENT_OTHER): Admitting: Obstetrics and Gynecology

## 2024-04-01 VITALS — BP 114/66 | HR 82

## 2024-04-01 DIAGNOSIS — Z3A34 34 weeks gestation of pregnancy: Secondary | ICD-10-CM | POA: Diagnosis present

## 2024-04-01 DIAGNOSIS — O09213 Supervision of pregnancy with history of pre-term labor, third trimester: Secondary | ICD-10-CM

## 2024-04-01 DIAGNOSIS — O0990 Supervision of high risk pregnancy, unspecified, unspecified trimester: Secondary | ICD-10-CM | POA: Insufficient documentation

## 2024-04-01 DIAGNOSIS — O34219 Maternal care for unspecified type scar from previous cesarean delivery: Secondary | ICD-10-CM

## 2024-04-01 DIAGNOSIS — O365930 Maternal care for other known or suspected poor fetal growth, third trimester, not applicable or unspecified: Secondary | ICD-10-CM | POA: Diagnosis present

## 2024-04-01 DIAGNOSIS — Z8759 Personal history of other complications of pregnancy, childbirth and the puerperium: Secondary | ICD-10-CM

## 2024-04-01 DIAGNOSIS — Z8751 Personal history of pre-term labor: Secondary | ICD-10-CM | POA: Diagnosis present

## 2024-04-01 DIAGNOSIS — E669 Obesity, unspecified: Secondary | ICD-10-CM

## 2024-04-01 DIAGNOSIS — O365920 Maternal care for other known or suspected poor fetal growth, second trimester, not applicable or unspecified: Secondary | ICD-10-CM | POA: Insufficient documentation

## 2024-04-01 NOTE — Progress Notes (Signed)
 After review, MFM consult with provider is not indicated for today  Arna Ranks, MD 04/01/2024 11:34 AM  [facility]

## 2024-04-15 ENCOUNTER — Encounter: Admitting: Obstetrics and Gynecology

## 2024-04-15 ENCOUNTER — Ambulatory Visit (HOSPITAL_BASED_OUTPATIENT_CLINIC_OR_DEPARTMENT_OTHER): Admitting: Obstetrics

## 2024-04-15 ENCOUNTER — Other Ambulatory Visit: Payer: Self-pay | Admitting: *Deleted

## 2024-04-15 ENCOUNTER — Ambulatory Visit: Attending: Obstetrics and Gynecology

## 2024-04-15 ENCOUNTER — Other Ambulatory Visit: Payer: Self-pay | Admitting: Obstetrics and Gynecology

## 2024-04-15 VITALS — BP 113/66

## 2024-04-15 DIAGNOSIS — O0990 Supervision of high risk pregnancy, unspecified, unspecified trimester: Secondary | ICD-10-CM

## 2024-04-15 DIAGNOSIS — O365920 Maternal care for other known or suspected poor fetal growth, second trimester, not applicable or unspecified: Secondary | ICD-10-CM

## 2024-04-15 DIAGNOSIS — Z3A36 36 weeks gestation of pregnancy: Secondary | ICD-10-CM

## 2024-04-15 DIAGNOSIS — O34219 Maternal care for unspecified type scar from previous cesarean delivery: Secondary | ICD-10-CM | POA: Insufficient documentation

## 2024-04-15 DIAGNOSIS — O365930 Maternal care for other known or suspected poor fetal growth, third trimester, not applicable or unspecified: Secondary | ICD-10-CM | POA: Insufficient documentation

## 2024-04-15 DIAGNOSIS — E669 Obesity, unspecified: Secondary | ICD-10-CM

## 2024-04-15 DIAGNOSIS — O09213 Supervision of pregnancy with history of pre-term labor, third trimester: Secondary | ICD-10-CM | POA: Diagnosis not present

## 2024-04-15 NOTE — Progress Notes (Signed)
 MFM Consult Note  Stephanie [name] is currently at 36 weeks and 3 days.  She has been followed due to IUGR.    She denies any problems since her last exam and reports feeling vigorous fetal movements throughout the day.    On today's exam, the EFW of 5 pounds 13 ounces measures at the 22nd percentile for her gestational age indicating that the previously noted IUGR has resolved.    The total AFI was 15.32 cm (within normal limits).  A BPP performed today was 8 out of 8.    Doppler studies of the umbilical arteries showed a normal S/D ratio of 1.92.  There were no signs of absent or reversed end-diastolic flow.    The patient's past pregnancy history includes a cesarean delivery at 34 weeks due to preeclampsia.  She reports that she would prefer to attempt a VBAC/TOLAC.  As IUGR has resolved, she was advised to await the spontaneous onset of labor in order to increase her chances of a successful VBAC.  She should  be delivered by her due date.    As IUGR was noted earlier in her pregnancy, we will continue to follow her with weekly fetal testing until delivery.    She will return in 1 week for an NST.    The patient stated that all of her questions were answered today.  A total of 20 minutes was spent counseling and coordinating the care for this patient.  Greater than 50% of the time was spent in direct face-to-face contact.

## 2024-04-21 ENCOUNTER — Encounter: Payer: Self-pay | Admitting: Physician Assistant

## 2024-04-22 ENCOUNTER — Ambulatory Visit: Attending: Obstetrics and Gynecology | Admitting: *Deleted

## 2024-04-22 DIAGNOSIS — Z3A37 37 weeks gestation of pregnancy: Secondary | ICD-10-CM | POA: Diagnosis not present

## 2024-04-22 DIAGNOSIS — O365930 Maternal care for other known or suspected poor fetal growth, third trimester, not applicable or unspecified: Secondary | ICD-10-CM | POA: Insufficient documentation

## 2024-04-22 NOTE — Procedures (Signed)
 Stephanie [name] October 24, 1995 [redacted]w[redacted]d  Fetus A Non-Stress Test Interpretation for 04/22/24  Indication: IUGR  Fetal Heart Rate A Mode: External Baseline Rate (A): 135 bpm Variability: Moderate Accelerations: 15 x 15 Decelerations: None Multiple birth?: No  Uterine Activity Mode: Toco Contraction Frequency (min): one u/c during NST Contraction Duration (sec): 60 Contraction Quality: Mild Resting Tone Palpated: Relaxed  Interpretation (Fetal Testing) Nonstress Test Interpretation: Reactive Comments: Tracing reviewed by Dr. arna

## 2024-04-29 ENCOUNTER — Ambulatory Visit

## 2024-04-29 ENCOUNTER — Ambulatory Visit (INDEPENDENT_AMBULATORY_CARE_PROVIDER_SITE_OTHER): Admitting: Obstetrics

## 2024-04-29 VITALS — BP 119/78 | HR 77 | Wt 205.0 lb

## 2024-04-29 DIAGNOSIS — Z3A38 38 weeks gestation of pregnancy: Secondary | ICD-10-CM

## 2024-04-29 DIAGNOSIS — Z8751 Personal history of pre-term labor: Secondary | ICD-10-CM | POA: Diagnosis not present

## 2024-04-29 DIAGNOSIS — Z8759 Personal history of other complications of pregnancy, childbirth and the puerperium: Secondary | ICD-10-CM | POA: Diagnosis not present

## 2024-04-29 DIAGNOSIS — O34219 Maternal care for unspecified type scar from previous cesarean delivery: Secondary | ICD-10-CM

## 2024-04-29 DIAGNOSIS — O0990 Supervision of high risk pregnancy, unspecified, unspecified trimester: Secondary | ICD-10-CM | POA: Diagnosis not present

## 2024-04-29 DIAGNOSIS — O365930 Maternal care for other known or suspected poor fetal growth, third trimester, not applicable or unspecified: Secondary | ICD-10-CM

## 2024-04-29 NOTE — Progress Notes (Signed)
 Pt presents for rob/ NST. Pt has no questions or concerns at this time.

## 2024-04-29 NOTE — Progress Notes (Signed)
 Subjective:  Stephanie [name] is a 28 y.o. [phone] at [redacted]w[redacted]d being seen today for ongoing prenatal care.  She is currently monitored for the following issues for this high-risk pregnancy and has Learning problem; History of severe pre-eclampsia; Supervision of high risk pregnancy, antepartum; History of preterm delivery; History of cesarean delivery, antepartum; History of fetal growth restriction; and Poor fetal growth affecting management of mother in third trimester on their problem list.  Patient reports occasional contractions.  Contractions: Irritability. Vag. Bleeding: None.  Movement: Present. Denies leaking of fluid.   The following portions of the patient's history were reviewed and updated as appropriate: allergies, current medications, past family history, past medical history, past social history, past surgical history and problem list. Problem list updated.  Objective:   Vitals:   04/29/24 0945  BP: 119/78  Pulse: 77  Weight: 205 lb (93 kg)    Fetal Status:     Movement: Present     General:  Alert, oriented and cooperative. Patient is in no acute distress.  Skin: Skin is warm and dry. No rash noted.   Cardiovascular: Normal heart rate noted  Respiratory: Normal respiratory effort, no problems with respiration noted  Abdomen: Soft, gravid, appropriate for gestational age. Pain/Pressure: Present     Pelvic:  Cervical exam deferred        Extremities: Normal range of motion.  Edema: Trace  Mental Status: Normal mood and affect. Normal behavior. Normal judgment and thought content.   Urinalysis:     Ultrasound:   US  MFM OB FOLLOW UP (Accession [id number]) (Order [id number]) Imaging Date: 04/15/2024 Department: [facility] Released By: Claudene Rumaldo HERO, RT Authorizing: Arna Ranks, MD   Exam Status  Status  Final [99]   PACS Intelerad Image Link   Show images for US  MFM OB FOLLOW UP Related Results          US  MFM FETAL BPP WO  NON STRESS Final result 04/15/2024       ----------------------------------------------------------------------  OBSTETRICS REPORT                       (Signed Final 04/15/2024 05:05 pm) ---------------------------------------------------------------------- Patient Info   ID #:       [id number]                          D.O.B.:  [birth year] (28 yrs)(F)  Name:       Stephanie [name]                 Visit Date: 04/15/2024 03:06 pm ---------------------------------------------------------------------- Performed By ...     US  MFM UA CORD DOPPLER Final result 04/15/2024       ----------------------------------------------------------------------  OBSTETRICS REPORT                       (Signed Final 04/15/2024 05:05 pm) ---------------------------------------------------------------------- Patient Info   ID #:       [id number]                          D.O.B.:  [birth year] (28 yrs)(F)  Name:       Stephanie [name]                 Visit Date: 04/15/2024 03:06 pm ---------------------------------------------------------------------- Performed By ...   Study Result  Narrative & Impression    ----------------------------------------------------------------------  OBSTETRICS REPORT                       (  Signed Final 04/15/2024 05:05 pm) ---------------------------------------------------------------------- Patient Info    ID #:       [id number]                          D.O.B.:  20-Sep-1995 (28 yrs)(F)  Name:       Stephanie [name]                 Visit Date: 04/15/2024 03:06 pm ---------------------------------------------------------------------- Performed By    Attending:        Steffan Keys MD         Ref. Address:     [address]                                                             [place], [place]                                                             [address]  Performed By:     Rumaldo Sharps RDMS      [facility]  Referred By:      [facility] ---------------------------------------------------------------------- Orders    #  Description                           Code        Ordered By  1  US  MFM OB FOLLOW UP                   [id number]    RAVI  [facility]  2  US  MFM UA CORD DOPPLER                76820.02    RAVI SHANKAR  3  US  MFM FETAL BPP WO NON               76819.01    RAVI [facility]     STRESS ----------------------------------------------------------------------    #  Order #                     Accession #                Episode #  1  [id number]                   [id number]                 [id number]  2  [id number]                   [id number]                 [id number]  3  [id number]                   [id number]                 [id number] ---------------------------------------------------------------------- Indications    Maternal care for known or suspected poor      O36.5930  fetal growth, third trimester, single fetus  IUGR - RESOLVED  Poor obstetric history (prior pre-term labor   O09.219  and severe pre-eclampsia)  History of cesarean delivery, currently        O34.219  pregnant  Low risk NIPS Neg AFP NEG Horizon GTT-  WNL  [redacted] weeks gestation of pregnancy                Z3A.36 ---------------------------------------------------------------------- Vital Signs    BP:          113/66 ---------------------------------------------------------------------- Fetal Evaluation    Num Of Fetuses:         1  Fetal Heart Rate(bpm):  136  Cardiac Activity:       Observed  Presentation:           Cephalic  Placenta:               Anterior  P. Cord Insertion:      Visualized  Amniotic Fluid  AFI FV:      Within normal limits    AFI Sum(cm)     %Tile        Largest Pocket(cm)  15.32           57          4.49    RUQ(cm)       RLQ(cm)       LUQ(cm)        LLQ(cm)  4.07          4.12          2.64           4.49 ---------------------------------------------------------------------- Biophysical Evaluation    Amniotic F.V:   Within normal limits       F. Tone:        Observed  F. Movement:    Observed                   Score:          8/8  F. Breathing:   Observed ----------------------------------------------------------------------  Biometry    BPD:      85.6  mm     G. Age:  34w 4d         12  %    CI:        75.88   %    70 - 86                                                          FL/HC:      21.5   %    20.1 - 22.1  HC:      311.5  mm     G. Age:  34w 6d          3  %    HC/AC:      0.98        0.93 - 1.11  AC:      319.2  mm     G. Age:  35w 6d         44  %    FL/BPD:     78.3   %    71 - 87  FL:         67  mm     G. Age:  34w 3d          7  %    FL/AC:      21.0   %    20 - 24  LV:        2.2  mm    Est. FW:    2626  gm    5 lb 13 oz      22  % ---------------------------------------------------------------------- OB History    Gravidity:    3         Term:   0        Prem:   1        SAB:   1  TOP:          0       Ectopic:  0        Living: 1 ---------------------------------------------------------------------- Gestational Age    LMP:           36w 3d        Date:  08/04/23                 EDD:   05/10/24  U/S Today:     35w 0d                                        EDD:   05/20/24  Best:          36w 3d     Det. By:  LMP  (08/04/23)          EDD:   05/10/24 ---------------------------------------------------------------------- Anatomy    Ventricles:            Appears normal         Stomach:                Appears normal, left  sided  Heart:                 Appears normal         Kidneys:                Appear normal                         (4CH, axis,  and                         situs)  Diaphragm:             Appears normal         Bladder:                Appears normal ---------------------------------------------------------------------- Doppler - Fetal Vessels    Umbilical Artery   S/D     %tile      RI    %tile      PI    %tile     PSV    ADFV    RDFV                                                     (cm/s)   1.92       19    0.48       14    0.66       17    42.38      No      No   ---------------------------------------------------------------------- Cervix Uterus Adnexa  Cervix  Not visualized (advanced GA >24wks)    Uterus  No abnormality visualized.    Right Ovary  Not visualized.    Left Ovary  Not visualized.    Cul De Sac  No free fluid seen.    Adnexa  No abnormality visualized ---------------------------------------------------------------------- Comments    Stephanie [name] is currently at 36 weeks and 3 days.  She has  been followed due to IUGR.  She denies any problems since her last exam and reports  feeling vigorous fetal movements throughout the day.  On today's exam, the EFW of 5 pounds 13 ounces measures  at the 22nd percentile for her gestational age indicating that  the previously noted IUGR has resolved.  The total AFI was 15.32 cm (within normal limits).  A BPP performed today was 8 out of 8.  Doppler studies of the umbilical arteries showed a normal  S/D ratio of 1.92 .  There were no signs of absent or reversed  end-diastolic flow.  The patient's past pregnancy history includes a cesarean  delivery at 34 weeks due to preeclampsia.  She reports that  she would prefer to attempt a VBAC/TOLAC.  As IUGR has resolved, she was advised to await the  spontaneous onset of labor in order to increase her chances  of a successful VBAC.  She should  be delivered by her due  date.  As IUGR was noted earlier in her pregnancy, we will continue  to follow her with weekly fetal testing until  delivery.  She will return in 1 week for an NST.  The patient stated that all of her questions were answered  today.  A total of 20 minutes was spent counseling and coordinating  the care for  this patient.  Greater than 50% of the time was  spent in direct face-to-face contact. ----------------------------------------------------------------------                   Steffan Keys, MD Electronically Signed Final Report   04/15/2024 05:05 pm ----------------------------------------------------------------------        Assessment and Plan:  Pregnancy: [id number] at [redacted]w[redacted]d  1. Supervision of high risk pregnancy, antepartum (Primary)  2. History of cesarean delivery, antepartum - desires TOLAC.  Consents signed.  3. History of fetal growth restriction  4. History of severe pre-eclampsia - taking Baby ASA  5. History of preterm delivery  6. Poor fetal growth affecting management of mother in third trimester, single or unspecified fetus,  Resolved. NST:  Reactive.  FHR 140's.  Good variability.  15 x 15 accels.  No decels. - delivery by due date, per Dr. Keys     Term labor symptoms and general obstetric precautions including but not limited to vaginal bleeding, contractions, leaking of fluid and fetal movement were reviewed in detail with the patient. Please refer to After Visit Summary for other counseling recommendations.   No follow-ups on file.   Rudy Carlin LABOR, MD 04/29/2024

## 2024-05-04 ENCOUNTER — Encounter (HOSPITAL_COMMUNITY): Payer: Self-pay | Admitting: *Deleted

## 2024-05-04 ENCOUNTER — Telehealth (HOSPITAL_COMMUNITY): Payer: Self-pay | Admitting: *Deleted

## 2024-05-04 NOTE — Telephone Encounter (Signed)
 Preadmission screen

## 2024-05-05 ENCOUNTER — Other Ambulatory Visit: Payer: Self-pay

## 2024-05-05 ENCOUNTER — Inpatient Hospital Stay (HOSPITAL_COMMUNITY)
Admission: AD | Admit: 2024-05-05 | Discharge: 2024-05-05 | Disposition: A | Discharge status: Home / Self Care | Attending: Obstetrics & Gynecology | Admitting: Obstetrics & Gynecology

## 2024-05-05 ENCOUNTER — Encounter (HOSPITAL_COMMUNITY): Payer: Self-pay | Admitting: Obstetrics & Gynecology

## 2024-05-05 ENCOUNTER — Encounter: Payer: Self-pay | Admitting: Obstetrics and Gynecology

## 2024-05-05 DIAGNOSIS — O26893 Other specified pregnancy related conditions, third trimester: Secondary | ICD-10-CM | POA: Diagnosis not present

## 2024-05-05 DIAGNOSIS — H538 Other visual disturbances: Secondary | ICD-10-CM | POA: Diagnosis not present

## 2024-05-05 DIAGNOSIS — Z3A39 39 weeks gestation of pregnancy: Secondary | ICD-10-CM | POA: Diagnosis not present

## 2024-05-05 DIAGNOSIS — O09293 Supervision of pregnancy with other poor reproductive or obstetric history, third trimester: Secondary | ICD-10-CM | POA: Insufficient documentation

## 2024-05-05 DIAGNOSIS — R42 Dizziness and giddiness: Secondary | ICD-10-CM | POA: Diagnosis not present

## 2024-05-05 DIAGNOSIS — O99891 Other specified diseases and conditions complicating pregnancy: Secondary | ICD-10-CM | POA: Insufficient documentation

## 2024-05-05 DIAGNOSIS — R519 Headache, unspecified: Secondary | ICD-10-CM

## 2024-05-05 LAB — COMPREHENSIVE METABOLIC PANEL WITH GFR
ALT: 13 U/L (ref 0–44)
AST: 14 U/L — ABNORMAL LOW (ref 15–41)
Albumin: 2.7 g/dL — ABNORMAL LOW (ref 3.5–5.0)
Alkaline Phosphatase: 114 U/L (ref 38–126)
Anion gap: 12 (ref 5–15)
BUN: 8 mg/dL (ref 6–20)
CO2: 18 mmol/L — ABNORMAL LOW (ref 22–32)
Calcium: 8.9 mg/dL (ref 8.9–10.3)
Chloride: 106 mmol/L (ref 98–111)
Creatinine, Ser: 0.55 mg/dL (ref 0.44–1.00)
GFR, Estimated: >60 mL/min (ref >60)
Glucose, Bld: 93 mg/dL (ref 70–99)
Potassium: 3.4 mmol/L — ABNORMAL LOW (ref 3.5–5.1)
Sodium: 136 mmol/L (ref 135–145)
Total Bilirubin: 0.6 mg/dL (ref 0.0–1.2)
Total Protein: 6 g/dL — ABNORMAL LOW (ref 6.5–8.1)

## 2024-05-05 LAB — CBC
HCT: 40.2 % (ref 36.0–46.0)
Hemoglobin: 13.7 g/dL (ref 12.0–15.0)
MCH: 29 pg (ref 26.0–34.0)
MCHC: 34.1 g/dL (ref 30.0–36.0)
MCV: 85.2 fL (ref 80.0–100.0)
Platelets: 300 K/uL (ref 150–400)
RBC: 4.72 MIL/uL (ref 3.87–5.11)
RDW: 13 % (ref 11.5–15.5)
WBC: 10.4 K/uL (ref 4.0–10.5)
nRBC: 0 % (ref 0.0–0.2)

## 2024-05-05 LAB — PROTEIN / CREATININE RATIO, URINE
Creatinine, Urine: 134 mg/dL
Protein Creatinine Ratio: 0.14 mg/mg{creat} (ref 0.00–0.15)
Total Protein, Urine: 19 mg/dL

## 2024-05-05 LAB — OB RESULTS CONSOLE GBS: GBS: POSITIVE

## 2024-05-05 MED ORDER — SODIUM CHLORIDE 0.9 % IV BOLUS
1000.0000 mL | Freq: Once | INTRAVENOUS | Status: AC
  Administered 2024-05-05: 1000 mL via INTRAVENOUS

## 2024-05-05 MED ORDER — ACETAMINOPHEN 500 MG PO TABS
1000.0000 mg | ORAL_TABLET | Freq: Once | ORAL | Status: AC
  Administered 2024-05-05: 1000 mg via ORAL
  Filled 2024-05-05: qty 2

## 2024-05-05 NOTE — MAU Provider Note (Signed)
  History     CSN: [id number]  Arrival date Stephanie time: 05/05/24 1438   None     Chief Complaint  [name] presents with   Headache   HPI  28 yo [id number] @ 39+2 here with 2 days headache, no weakness or numbness, but also feeling lightheaded. No fever or cough, no dyspnea, no dysuria. No vomiting or diarrhea. Also intermittent blurry vision. Normal fetal movement, no LOF, no bleeding, no contractions.  Past Medical History:  Diagnosis Date   History of pre-eclampsia    Pregnancy induced hypertension     Past Surgical History:  Procedure Laterality Date   CESAREAN SECTION  07/17/2021   Procedure: CESAREAN SECTION;  Surgeon: Zina Jerilynn LABOR, MD;  Location: [facility];  Service: Obstetrics;;   WISDOM TOOTH EXTRACTION Bilateral     Family History  Problem Relation Age of Onset   Migraines Mother    Hypertension Father    Asthma Brother    Diabetes Maternal Grandmother     Social History   Tobacco Use   Smoking status: Never   Smokeless tobacco: Never  Vaping Use   Vaping status: Never Used  Substance Use Topics   Alcohol use: Never   Drug use: Never    Allergies: No Known Allergies  Medications Prior to Admission  Medication Sig Dispense Refill Last Dose/Taking   aspirin  EC 81 MG tablet Take 2 tablets (162 mg total) by mouth daily. Start taking when you are [redacted] weeks pregnant for rest of pregnancy for prevention of preeclampsia 300 tablet 2 05/05/2024   Prenatal Vit-Fe Fumarate-FA (PRENATAL MULTIVITAMIN) TABS tablet Take 1 tablet by mouth daily at 12 noon.   05/05/2024   doxylamine , Sleep, (UNISOM ) 25 MG tablet Take 25 mg by mouth at bedtime as needed (nausea). ([name] not taking: Reported on 04/29/2024)      metoCLOPramide  (REGLAN ) 10 MG tablet Take 1 tablet (10 mg total) by mouth every 6 (six) hours as needed for nausea. ([name] not taking: Reported on 04/29/2024) 30 tablet 0     Review of Systems Physical Exam   Blood pressure 109/76, pulse (!) 57, temperature  98.6 F (37 C), temperature source Oral, resp. rate 18, height 5' 3 (1.6 m), weight 92.5 kg, last menstrual period 08/04/2023, SpO2 95%, currently breastfeeding.  Physical Exam NAD RRR CTAB Cn 2-12 grossly intact, 5/5 upper Stephanie lower strength Abdomen gravid, non-tender No edema   Assessment Stephanie Plan  [name] presents with headache, lightheadedness, Stephanie intermittent blurry vision. History preeclampsia in last pregnancy. Here normotensive on serial monitoring, labs unremarkable. We gave 1 liter of fluids Stephanie tylenol  Stephanie symptoms resolved. Not in labor, no PROM. This is a headache that has resolved. For her resolved FGR she has an induction scheduled on 9/23. She has clinic f/u tomorrow. GBS hasn't yet been sent, so we will send it along with g/c. PreE return precautions provided.  Stephanie [name] Stephanie [name] 05/05/2024, 5:56 PM

## 2024-05-05 NOTE — MAU Note (Signed)
.  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 She states she just feels off. Denies vaginal bleeding and LOF. +FM   Onset of complaint: 05/05/2024 Pain score: 7/10 Vitals:   05/05/24 1518  BP: 122/81  Pulse: 70  Resp: 18  Temp: 98.6 F (37 C)  SpO2: 95%     FHT: 150  Lab orders placed from triage: UA

## 2024-05-06 ENCOUNTER — Ambulatory Visit: Admitting: Family Medicine

## 2024-05-06 ENCOUNTER — Encounter: Payer: Self-pay | Admitting: Family Medicine

## 2024-05-06 VITALS — BP 114/78 | HR 83 | Wt 206.8 lb

## 2024-05-06 DIAGNOSIS — O0990 Supervision of high risk pregnancy, unspecified, unspecified trimester: Secondary | ICD-10-CM | POA: Diagnosis not present

## 2024-05-06 DIAGNOSIS — O34219 Maternal care for unspecified type scar from previous cesarean delivery: Secondary | ICD-10-CM | POA: Diagnosis not present

## 2024-05-06 DIAGNOSIS — Z3A39 39 weeks gestation of pregnancy: Secondary | ICD-10-CM

## 2024-05-06 DIAGNOSIS — Z8759 Personal history of other complications of pregnancy, childbirth and the puerperium: Secondary | ICD-10-CM | POA: Diagnosis not present

## 2024-05-06 DIAGNOSIS — O365930 Maternal care for other known or suspected poor fetal growth, third trimester, not applicable or unspecified: Secondary | ICD-10-CM | POA: Diagnosis not present

## 2024-05-06 LAB — GC/CHLAMYDIA PROBE AMP (~~LOC~~) NOT AT ARMC
Chlamydia: NEGATIVE
Comment: NEGATIVE
Comment: NORMAL
Neisseria Gonorrhea: NEGATIVE

## 2024-05-06 NOTE — Patient Instructions (Addendum)
 Reasons to return to MAU at [facility]: Your blood pressure is >160/110. You have a headache that will not go away after having something to eat, something to drink, and Tylenol . You have changes in your vision or upper abdominal pain that does not get better with Tylenol . More than 36 weeks: You begin to have strong, frequent contractions 5 minutes apart or less, each last 1 minute, these have been going on for 1-2 hours, and you cannot walk or talk during them. Your water  breaks.  Sometimes it is a big gush of fluid. However, many times it may it may be much more subtle. You should go to the hospital if you have a constant leakage of fluid from your vagina, enough to soak a pad when you are walking around.  You have vaginal bleeding.  It is normal to have a small amount of spotting if your cervix was checked. If you have bleeding requiring the use of a pad, go to the hospital. You don't feel your baby moving like normal.  If you think that you baby's movement is decreased, eat a snack and rest on your left side in a quiet room for one hour. If you have not felt the baby move more than 6 times in an hour GO TO THE HOSPITAL.    INDUCTION: Congratulations, you're on your way to having your baby!!!   You now have an induction scheduled.   If your induction is scheduled for the daytime, you will see an appointment time in MyChart. Please DO NOT show up at this time, this is just a placeholder on the schedule. You will get a call when your room is ready and will have 2 hours to arrive. The hospital staff can call anytime starting at 5 am through the rest of the day.   You will also get a call from the pre-admission nurse to go over pre-admission screen about 2 days prior to your induction date.

## 2024-05-06 NOTE — Progress Notes (Signed)
   PRENATAL VISIT NOTE  Subjective:  Stephanie [name] is a 28 y.o. [phone] at [redacted]w[redacted]d being seen today for ongoing prenatal care.  She is currently monitored for the following issues for this high-risk pregnancy and has Learning problem; History of severe pre-eclampsia; Supervision of high risk pregnancy, antepartum; History of preterm delivery; History of cesarean delivery, antepartum; History of fetal growth restriction; and Poor fetal growth affecting management of mother in third trimester on their problem list.  Patient reports increase in cramping since last night and would like cervix checked today. Pt went to MAU yesterday due to lightheadedness, headache, and dizziness. She was given IV fluids and Tylenol , symptoms resolved. Pt denies headache, dizziness, vision changes since that time. Preeclampsia workup was negative. She is scheduled for IOL on 9/23.  Contractions: Irritability. Vag. Bleeding: None.  Movement: Present. Denies leaking of fluid.   The following portions of the patient's history were reviewed and updated as appropriate: allergies, current medications, past family history, past medical history, past social history, past surgical history and problem list.   Objective:    Vitals:   05/06/24 0913  BP: 114/78  Pulse: 83  Weight: 206 lb 12.8 oz (93.8 kg)    Fetal Status:  Fetal Heart Rate (bpm): 145   Movement: Present    General: Alert, oriented and cooperative. Patient is in no acute distress.  Skin: Skin is warm and dry. No rash noted.   Cardiovascular: Normal heart rate noted  Respiratory: Normal respiratory effort, no problems with respiration noted  Abdomen: Soft, gravid, appropriate for gestational age.  Pain/Pressure: Present     Pelvic: Cervical exam performed in the presence of a chaperone        Extremities: Normal range of motion.  Edema: None  Mental Status: Normal mood and affect. Normal behavior. Normal judgment and thought content.   Assessment and  Plan:  Pregnancy: [id number] at [redacted]w[redacted]d 1. Supervision of high risk pregnancy, antepartum (Primary) Prenatal course reviewed BP, HR, FHR within normal limits Feeling regular FM   2. Poor fetal growth affecting management of mother in third trimester, single or unspecified fetus Resolved - EFW 2626 g (22%) on US  04/15/2024. Weekly NST until delivery per MFM. Deliver by due date per MFM.  3. History of severe pre-eclampsia Continue aspirin  162 mg daily  4. History of cesarean delivery, antepartum Plan for TOLAC, consent signed 12/18/2023  5. [redacted] weeks gestation of pregnancy IOL on 9/23  Term labor symptoms and general obstetric precautions including but not limited to vaginal bleeding, contractions, leaking of fluid and fetal movement were reviewed in detail with the patient. Please refer to After Visit Summary for other counseling recommendations.   Daytime IOL on 05/10/2024.  Future Appointments  Date Time Provider Department Center  05/10/2024  7:15 AM [facility] SCHED ROOM [facility] None    Joesph DELENA Sear, [place]

## 2024-05-06 NOTE — Progress Notes (Signed)
 Pt states an increase in cramping since last night. Would like cervix checked today.   Pt went to MAU yesterday due to lightheadedness, headache, and dizziness. She was given liquid IV and tylenol . Pt denies any of these symptoms currently. She states blood work was also done at MAU and everything came back normal.

## 2024-05-08 LAB — CULTURE, BETA STREP (GROUP B ONLY)

## 2024-05-09 ENCOUNTER — Ambulatory Visit: Payer: Self-pay

## 2024-05-09 DIAGNOSIS — O99820 Streptococcus B carrier state complicating pregnancy: Secondary | ICD-10-CM | POA: Insufficient documentation

## 2024-05-10 ENCOUNTER — Inpatient Hospital Stay (HOSPITAL_COMMUNITY): Admitting: Anesthesiology

## 2024-05-10 ENCOUNTER — Inpatient Hospital Stay (HOSPITAL_COMMUNITY)
Admission: RE | Admit: 2024-05-10 | Discharge: 2024-05-13 | DRG: 787 | Disposition: A | Discharge status: Home / Self Care | Attending: Family Medicine | Admitting: Family Medicine

## 2024-05-10 ENCOUNTER — Inpatient Hospital Stay (HOSPITAL_COMMUNITY)

## 2024-05-10 ENCOUNTER — Other Ambulatory Visit: Payer: Self-pay

## 2024-05-10 ENCOUNTER — Encounter (HOSPITAL_COMMUNITY): Payer: Self-pay | Admitting: Family Medicine

## 2024-05-10 DIAGNOSIS — Z8249 Family history of ischemic heart disease and other diseases of the circulatory system: Secondary | ICD-10-CM

## 2024-05-10 DIAGNOSIS — O99824 Streptococcus B carrier state complicating childbirth: Secondary | ICD-10-CM | POA: Diagnosis present

## 2024-05-10 DIAGNOSIS — D62 Acute posthemorrhagic anemia: Secondary | ICD-10-CM | POA: Diagnosis not present

## 2024-05-10 DIAGNOSIS — O365930 Maternal care for other known or suspected poor fetal growth, third trimester, not applicable or unspecified: Principal | ICD-10-CM | POA: Diagnosis present

## 2024-05-10 DIAGNOSIS — O9081 Anemia of the puerperium: Secondary | ICD-10-CM | POA: Diagnosis not present

## 2024-05-10 DIAGNOSIS — Z3A40 40 weeks gestation of pregnancy: Secondary | ICD-10-CM

## 2024-05-10 DIAGNOSIS — Z833 Family history of diabetes mellitus: Secondary | ICD-10-CM | POA: Diagnosis not present

## 2024-05-10 DIAGNOSIS — O34211 Maternal care for low transverse scar from previous cesarean delivery: Secondary | ICD-10-CM | POA: Diagnosis present

## 2024-05-10 DIAGNOSIS — Z3490 Encounter for supervision of normal pregnancy, unspecified, unspecified trimester: Secondary | ICD-10-CM

## 2024-05-10 DIAGNOSIS — O34219 Maternal care for unspecified type scar from previous cesarean delivery: Secondary | ICD-10-CM

## 2024-05-10 DIAGNOSIS — O0990 Supervision of high risk pregnancy, unspecified, unspecified trimester: Principal | ICD-10-CM

## 2024-05-10 DIAGNOSIS — O480 Post-term pregnancy: Secondary | ICD-10-CM | POA: Diagnosis not present

## 2024-05-10 DIAGNOSIS — O99820 Streptococcus B carrier state complicating pregnancy: Secondary | ICD-10-CM | POA: Diagnosis not present

## 2024-05-10 LAB — CBC
HCT: 41.6 % (ref 36.0–46.0)
Hemoglobin: 14 g/dL (ref 12.0–15.0)
MCH: 28.9 pg (ref 26.0–34.0)
MCHC: 33.7 g/dL (ref 30.0–36.0)
MCV: 86 fL (ref 80.0–100.0)
Platelets: 318 K/uL (ref 150–400)
RBC: 4.84 MIL/uL (ref 3.87–5.11)
RDW: 13.1 % (ref 11.5–15.5)
WBC: 9.7 K/uL (ref 4.0–10.5)
nRBC: 0 % (ref 0.0–0.2)

## 2024-05-10 LAB — TYPE AND SCREEN
ABO/RH(D): O POS
Antibody Screen: NEGATIVE

## 2024-05-10 LAB — RPR: RPR Ser Ql: NONREACTIVE

## 2024-05-10 MED ORDER — LACTATED RINGERS IV SOLN
500.0000 mL | Freq: Once | INTRAVENOUS | Status: AC
  Administered 2024-05-10: 500 mL via INTRAVENOUS

## 2024-05-10 MED ORDER — ACETAMINOPHEN 325 MG PO TABS
650.0000 mg | ORAL_TABLET | ORAL | Status: DC | PRN
  Administered 2024-05-10: 650 mg via ORAL
  Filled 2024-05-10: qty 2

## 2024-05-10 MED ORDER — OXYTOCIN-SODIUM CHLORIDE 30-0.9 UT/500ML-% IV SOLN
2.5000 [IU]/h | INTRAVENOUS | Status: DC
  Filled 2024-05-10: qty 500

## 2024-05-10 MED ORDER — EPHEDRINE 5 MG/ML INJ: 10.0000 mg | INTRAVENOUS | Status: DC | PRN

## 2024-05-10 MED ORDER — LIDOCAINE-EPINEPHRINE (PF) 1.5 %-1:200000 IJ SOLN
INTRAMUSCULAR | Status: DC | PRN
  Administered 2024-05-10: 5 mL via EPIDURAL

## 2024-05-10 MED ORDER — LACTATED RINGERS IV SOLN: INTRAVENOUS | Status: DC

## 2024-05-10 MED ORDER — ZOLPIDEM TARTRATE 5 MG PO TABS: 5.0000 mg | ORAL_TABLET | Freq: Every evening | ORAL | Status: DC | PRN

## 2024-05-10 MED ORDER — SODIUM CHLORIDE 0.9 % IV SOLN
5.0000 10*6.[IU] | Freq: Once | INTRAVENOUS | Status: AC
  Administered 2024-05-10: 5 10*6.[IU] via INTRAVENOUS
  Filled 2024-05-10: qty 5

## 2024-05-10 MED ORDER — OXYTOCIN-SODIUM CHLORIDE 30-0.9 UT/500ML-% IV SOLN
1.0000 m[IU]/min | INTRAVENOUS | Status: DC
  Administered 2024-05-10 – 2024-05-11 (×2): 2 m[IU]/min via INTRAVENOUS

## 2024-05-10 MED ORDER — LACTATED RINGERS AMNIOINFUSION: INTRAVENOUS | Status: DC

## 2024-05-10 MED ORDER — ONDANSETRON HCL 4 MG/2ML IJ SOLN
4.0000 mg | Freq: Four times a day (QID) | INTRAMUSCULAR | Status: DC | PRN
  Administered 2024-05-10: 4 mg via INTRAVENOUS
  Filled 2024-05-10: qty 2

## 2024-05-10 MED ORDER — FENTANYL-BUPIVACAINE-NACL 0.5-0.125-0.9 MG/250ML-% EP SOLN
12.0000 mL/h | EPIDURAL | Status: DC | PRN
  Administered 2024-05-10: 12 mL/h via EPIDURAL
  Filled 2024-05-10 (×2): qty 250

## 2024-05-10 MED ORDER — LIDOCAINE HCL (PF) 1 % IJ SOLN: 30.0000 mL | INTRAMUSCULAR | Status: DC | PRN

## 2024-05-10 MED ORDER — OXYCODONE-ACETAMINOPHEN 5-325 MG PO TABS: 2.0000 | ORAL_TABLET | ORAL | Status: DC | PRN

## 2024-05-10 MED ORDER — PHENYLEPHRINE 80 MCG/ML (10ML) SYRINGE FOR IV PUSH (FOR BLOOD PRESSURE SUPPORT): 80.0000 ug | PREFILLED_SYRINGE | INTRAVENOUS | Status: DC | PRN

## 2024-05-10 MED ORDER — FENTANYL CITRATE (PF) 100 MCG/2ML IJ SOLN
50.0000 ug | INTRAMUSCULAR | Status: DC | PRN
  Administered 2024-05-10: 50 ug via INTRAVENOUS
  Administered 2024-05-10 (×3): 100 ug via INTRAVENOUS
  Filled 2024-05-10 (×4): qty 2

## 2024-05-10 MED ORDER — SOD CITRATE-CITRIC ACID 500-334 MG/5ML PO SOLN: 30.0000 mL | ORAL | Status: DC | PRN

## 2024-05-10 MED ORDER — TERBUTALINE SULFATE 1 MG/ML IJ SOLN: 0.2500 mg | Freq: Once | INTRAMUSCULAR | Status: DC | PRN

## 2024-05-10 MED ORDER — OXYCODONE-ACETAMINOPHEN 5-325 MG PO TABS: 1.0000 | ORAL_TABLET | ORAL | Status: DC | PRN

## 2024-05-10 MED ORDER — PENICILLIN G POT IN DEXTROSE 60000 UNIT/ML IV SOLN
3.0000 10*6.[IU] | INTRAVENOUS | Status: DC
  Administered 2024-05-10 – 2024-05-11 (×5): 3 10*6.[IU] via INTRAVENOUS
  Filled 2024-05-10 (×7): qty 50

## 2024-05-10 MED ORDER — DIPHENHYDRAMINE HCL 50 MG/ML IJ SOLN
12.5000 mg | INTRAMUSCULAR | Status: DC | PRN
  Filled 2024-05-10: qty 1

## 2024-05-10 MED ORDER — OXYTOCIN BOLUS FROM INFUSION: 333.0000 mL | Freq: Once | INTRAVENOUS | Status: DC

## 2024-05-10 MED ORDER — LACTATED RINGERS IV SOLN: 500.0000 mL | INTRAVENOUS | Status: DC | PRN

## 2024-05-10 NOTE — Anesthesia Preprocedure Evaluation (Signed)
 Anesthesia Evaluation  Patient identified by MRN, date of birth, ID band Patient awake    Reviewed: Allergy & Precautions, NPO status , Patient's Chart, lab work & pertinent test results  Airway Mallampati: II  TM Distance: >3 FB Neck ROM: Full    Dental no notable dental hx.    Pulmonary neg pulmonary ROS   Pulmonary exam normal        Cardiovascular hypertension,  Rhythm:Regular Rate:Normal     Neuro/Psych negative neurological ROS  negative psych ROS   GI/Hepatic negative GI ROS,,,  Endo/Other  negative endocrine ROS    Renal/GU negative Renal ROS  negative genitourinary   Musculoskeletal negative musculoskeletal ROS (+)    Abdominal Normal abdominal exam  (+)   Peds  Hematology Lab Results      Component                Value               Date                      WBC                      9.7                 05/10/2024                HGB                      14.0                05/10/2024                HCT                      41.6                05/10/2024                MCV                      86.0                05/10/2024                PLT                      318                 05/10/2024              Anesthesia Other Findings   Reproductive/Obstetrics (+) Pregnancy                              Anesthesia Physical Anesthesia Plan  ASA: 2  Anesthesia Plan: Epidural   Post-op Pain Management:    Induction:   PONV Risk Score and Plan: 2 and Treatment may vary due to age or medical condition  Airway Management Planned: Natural Airway  Additional Equipment: None  Intra-op Plan:   Post-operative Plan:   Informed Consent: I have reviewed the patients History and Physical, chart, labs and discussed the procedure including the risks, benefits and alternatives for the proposed anesthesia with the patient or authorized representative who has indicated his/her  understanding and acceptance.     Dental  advisory given  Plan Discussed with:   Anesthesia Plan Comments:         Anesthesia Quick Evaluation

## 2024-05-10 NOTE — Progress Notes (Signed)
 Labor Progress Note  In to see patient for FB insertion.  Starting to feel more crampy/uncomfortable with Pit @ 54ml/h SCE 1/l/high FHR 140bpm, moderate variability, +accels, no decels Toco irritable  Offered attempt at [facility] insertion with/without analgesia. Reviewed r/b fentanyl  and she would like to try FB attempt w/ medication.  FB placed without complication. Pt tolerated well.  Continue IOL with FB/pitocin   Kieth Carolin, MD Obstetrician & Gynecologist, [facility] for [facility], [facility]

## 2024-05-10 NOTE — Anesthesia Procedure Notes (Signed)
 Epidural Patient location during procedure: OB Start time: 05/10/2024 5:32 PM End time: 05/10/2024 5:37 PM  Staffing Anesthesiologist: Dorethea Cordella SQUIBB, DO Performed: anesthesiologist   Preanesthetic Checklist Completed: patient identified, IV checked, site marked, risks and benefits discussed, surgical consent, monitors and equipment checked, pre-op evaluation and timeout performed  Epidural Patient position: sitting Prep: ChloraPrep Patient monitoring: heart rate, continuous pulse ox and blood pressure Approach: midline Location: L3-L4 Injection technique: LOR saline  Needle:  Needle type: Tuohy  Needle gauge: 17 G Needle length: 9 cm Needle insertion depth: 8 cm Catheter type: closed end flexible Catheter size: 20 Guage Catheter at skin depth: 14 cm Test dose: negative and 1.5% lidocaine  with Epi 1:200 K  Assessment Events: blood not aspirated, no cerebrospinal fluid, injection not painful, no injection resistance and no paresthesia  Additional Notes Patient identified. Risks/Benefits/Options discussed with patient including but not limited to bleeding, infection, nerve damage, paralysis, failed block, incomplete pain control, headache, blood pressure changes, nausea, vomiting, reactions to medications, itching and postpartum back pain. Confirmed with bedside nurse the patient's most recent platelet count. Confirmed with patient that they are not currently taking any anticoagulation, have any bleeding history or any family history of bleeding disorders. Patient expressed understanding and wished to proceed. All questions were answered. Sterile technique was used throughout the entire procedure. Please see nursing notes for vital signs. Test dose was given through epidural catheter and negative prior to continuing to dose epidural or start infusion. Warning signs of high block given to the patient including shortness of breath, tingling/numbness in hands, complete motor block,  or any concerning symptoms with instructions to call for help. Patient was given instructions on fall risk and not to get out of bed. All questions and concerns addressed with instructions to call with any issues or inadequate analgesia.    Reason for block:procedure for pain

## 2024-05-10 NOTE — H&P (Signed)
 OBSTETRIC ADMISSION HISTORY AND PHYSICAL  Stephanie [name] is a 28 y.o. female [phone] with IUP at [redacted]w[redacted]d by LMP presenting for IOL/TOLAC for FGR which resolved, but MFM still recommended delivery by due date. She reports +FMs, No LOF, no VB, no blurry vision, headaches or peripheral edema, and RUQ pain.  She plans on breast and bottle feeding. She is undecided for birth control.  She received her prenatal care at [facility]   Dating: By LMP --->  Estimated Date of Delivery: 05/10/24  Sono:    @[redacted]w[redacted]d , normal anatomy, cephalic presentation, anterior placenta, 2626g, 22% EFW   Prenatal History/Complications: hx of pre-eclampsia with prior pregnancy with preterm delivery, hx of FGR resolved, hx of prior c-section (for NRFHT)  Past Medical History: Past Medical History:  Diagnosis Date   History of pre-eclampsia    Pregnancy induced hypertension     Past Surgical History: Past Surgical History:  Procedure Laterality Date   CESAREAN SECTION  07/17/2021   Procedure: CESAREAN SECTION;  Surgeon: Zina Jerilynn LABOR, MD;  Location: [facility] LD ORS;  Service: Obstetrics;;   WISDOM TOOTH EXTRACTION Bilateral     Obstetrical History: OB History     Gravida  3   Para  1   Term  0   Preterm  1   AB  1   Living  1      SAB  1   IAB  0   Ectopic  0   Multiple  0   Live Births  1        Obstetric Comments  2022 induced for  BP         Social History Social History   Socioeconomic History   Marital status: Single    Spouse name: Not on file   Number of children: Not on file   Years of education: Not on file   Highest education level: Not on file  Occupational History   Not on file  Tobacco Use   Smoking status: Never   Smokeless tobacco: Never  Vaping Use   Vaping status: Never Used  Substance and Sexual Activity   Alcohol use: Never   Drug use: Never   Sexual activity: Yes    Partners: Male    Birth control/protection: None  Other Topics Concern   Not on file   Social History Narrative   Not on file   Social Drivers of Health   Financial Resource Strain: Not on file  Food Insecurity: No Food Insecurity (05/10/2024)   Hunger Vital Sign    Worried About Running Out of Food in the Last Year: Never true    Ran Out of Food in the Last Year: Never true  Transportation Needs: No Transportation Needs (05/10/2024)   PRAPARE - Administrator, Civil Service (Medical): No    Lack of Transportation (Non-Medical): No  Physical Activity: Not on file  Stress: Not on file  Social Connections: Not on file    Family History: Family History  Problem Relation Age of Onset   Migraines Mother    Hypertension Father    Asthma Brother    Diabetes Maternal Grandmother     Allergies: No Known Allergies  Medications Prior to Admission  Medication Sig Dispense Refill Last Dose/Taking   aspirin  EC 81 MG tablet Take 2 tablets (162 mg total) by mouth daily. Start taking when you are [redacted] weeks pregnant for rest of pregnancy for prevention of preeclampsia 300 tablet 2 05/09/2024   Prenatal Vit-Fe Fumarate-FA (  PRENATAL MULTIVITAMIN) TABS tablet Take 1 tablet by mouth daily at 12 noon.   05/09/2024     Review of Systems   All systems reviewed and negative except as stated in HPI  Blood pressure 117/74, pulse 86, temperature 98.2 F (36.8 C), temperature source Oral, resp. rate 18, height 5' 3 (1.6 m), weight 94.6 kg, last menstrual period 08/04/2023, currently breastfeeding. General appearance: alert, cooperative, and appears stated age Lungs: clear to auscultation bilaterally Heart: regular rate and rhythm Abdomen: soft, non-tender; bowel sounds normal Pelvic: 0/70/-3 Extremities: no LE edema Fetal monitoringBaseline: 145 bpm, Variability: Good {> 6 bpm), Accelerations: Reactive, and Decelerations: Absent Uterine activityFrequency: Every 3 minutes, feeling cramping only    Prenatal labs: ABO, Rh: O/Positive/-- (03/07 0857) Antibody: Negative  (03/07 0857) Rubella: 1.58 (03/07 0857) RPR: Non Reactive (06/27 0826)  HBsAg: Negative (03/07 0857)  HIV: Non Reactive (06/27 0826)  GBS: Positive/-- (09/18 0000)    Lab Results  Component Value Date   GBS Positive 05/05/2024    Immunization History  Administered Date(s) Administered   Influenza,inj,Quad PF,6+ Mos 05/15/2021   Tdap 06/12/2021, 03/31/2024          NURSING  PROVIDER  Office Location [facility] Dating by LMP c/w U/S at [redacted]w[redacted]d wks  [facility] Model Traditional Anatomy U/S Normal  Initiated care at  [facility]  English               LAB RESULTS   Support Person  [facility] [employer] NIPS: LR XX AFP: Negative      NT/IT (FT only)        Carrier Screen Horizon:   Rhogam  O/Positive/-- (03/07 0857) A1C/GTT Early HgbA1C: 5.2% Third trimester 2 hr GTT:   Flu Vaccine  Yes      TDaP Vaccine  03/31/24 Blood Type O/Positive/-- (03/07 0857)  RSV Vaccine   Antibody Negative (03/07 0857)Negative  COVID Vaccine  No Rubella 1.58 (03/07 0857)1.58  Feeding Plan breast RPR Non Reactive (06/27 0826)Nonreactive  Contraception Undecided HBsAg Negative (03/07 0857)Negative  Circumcision  N/a HIV Non Reactive (06/27 0826)Nonreactive  Pediatrician   [facility] HCVAb Non Reactive (03/07 0857)Nonreactive  Prenatal Classes        BTL Consent   Pap No results found for: DIAGPAP  BTL Pre-payment   GC/CT Initial:   36wks:    VBAC Consent 12/18/23 GBS For PCN allergy, check sensitivities   BRx Optimized? [ ]  yes   [X]  no      DME Rx [X]  BP cuff [ ]  Weight Scale Waterbirth  [ ]  Class [ ]  Consent [ ]  CNM visit  PHQ9 & GAD7 [X]  new OB [  ] 28 weeks  [  ] 36 weeks Induction  [ ]  Orders Entered [ ] Foley Y/N      No results found for this or any previous visit (from the past 24 hours).  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 H6E9888 at [redacted]w[redacted]d here for IOL/TOLAC for hx of FGR now resolved  #Labor:start pitocin , FB when possible #Pain: Epidural when desired #FWB: Cat I #GBS status:  positive #Feeding: Breastmilk  and Formula #Reproductive Life planning: Undecided #Circ:  not applicable  Leeroy KATHEE Pouch, MD  05/10/2024, 7:31 AM

## 2024-05-10 NOTE — Progress Notes (Signed)
 Labor Progress Note Providencia Hottenstein is a 28 y.o. [phone] at [redacted]w[redacted]d presented for IOL/TOLAC for resolved FGR  S:  Comfortable  O:  BP (!) 99/51   Pulse 62   Temp 98.3 F (36.8 C) (Oral)   Resp 16   Ht 5' 3 (1.6 m)   Wt 94.6 kg   LMP 08/04/2023 (Exact Date)   SpO2 100%   BMI 36.95 kg/m  EFM: baseline 150 bpm/ moderate variability/ + accels/ recurrent variable decels  Toco/IUPC: ctx q2-47min SVE: Dilation: 4.5 Effacement (%): 60 Cervical Position: Posterior Station: -3 Presentation: Vertex Exam by:: Dr. Jomarie Pitocin : 10 mu/min  A/P: 28 y.o. H6E9888 [redacted]w[redacted]d in latent labor 1. Labor: Seen at bedside due to recurrent variables that would resolve for short periods of time with position changes. IUPC placed, will start amnioinfusion  2. Pain: Epidural 3. GBS positive, adequate ppx received  - Anticipate SVD.  Charlie DELENA Jomarie, MD 10:16 PM

## 2024-05-10 NOTE — Progress Notes (Signed)
 Labor Progress Note Stephanie [name] is a 28 y.o. [phone] at [redacted]w[redacted]d presented for IOL/TOLAC for resolved FGR  S:  Nauseous, also notes headache  O:  BP 99/66   Pulse 75   Temp 98.3 F (36.8 C) (Oral)   Resp 16   Ht 5' 3 (1.6 m)   Wt 94.6 kg   LMP 08/04/2023 (Exact Date)   SpO2 100%   BMI 36.95 kg/m  EFM: baseline 160 bpm/ moderate variability/ + accels/ occasional early decels  Toco/IUPC: ctx q2-35min SVE: Dilation: 4.5 Effacement (%): 60 Cervical Position: Posterior Station: -3 Presentation: Vertex Exam by:: Dr. Jomarie Pitocin : 10 mu/min  A/P: 28 y.o. H6E9888 [redacted]w[redacted]d in latent labor 1. Labor: AROM for clear fluid, continues on pitocin . Advised to try position changes 2. Pain: Epidural 3. GBS positive, adequate ppx received  - Anticipate SVD.  Charlie DELENA Jomarie, MD 8:59 PM

## 2024-05-11 ENCOUNTER — Encounter (HOSPITAL_COMMUNITY): Payer: Self-pay | Admitting: Obstetrics and Gynecology

## 2024-05-11 ENCOUNTER — Encounter (HOSPITAL_COMMUNITY)
Admission: RE | Disposition: A | Discharge status: Home / Self Care | Payer: Self-pay | Source: Home / Self Care | Attending: Family Medicine

## 2024-05-11 ENCOUNTER — Other Ambulatory Visit: Payer: Self-pay

## 2024-05-11 DIAGNOSIS — Z3A40 40 weeks gestation of pregnancy: Secondary | ICD-10-CM

## 2024-05-11 DIAGNOSIS — O34211 Maternal care for low transverse scar from previous cesarean delivery: Secondary | ICD-10-CM

## 2024-05-11 DIAGNOSIS — O365930 Maternal care for other known or suspected poor fetal growth, third trimester, not applicable or unspecified: Secondary | ICD-10-CM

## 2024-05-11 DIAGNOSIS — O99820 Streptococcus B carrier state complicating pregnancy: Secondary | ICD-10-CM

## 2024-05-11 DIAGNOSIS — O480 Post-term pregnancy: Secondary | ICD-10-CM

## 2024-05-11 SURGERY — Surgical Case
Anesthesia: Epidural

## 2024-05-11 MED ORDER — MORPHINE SULFATE (PF) 10 MG/ML IV SOLN
INTRAVENOUS | Status: DC | PRN
  Administered 2024-05-11: 3 mg via EPIDURAL

## 2024-05-11 MED ORDER — NALOXONE HCL 0.4 MG/ML IJ SOLN: 0.4000 mg | INTRAMUSCULAR | Status: DC | PRN

## 2024-05-11 MED ORDER — ZOLPIDEM TARTRATE 5 MG PO TABS: 5.0000 mg | ORAL_TABLET | Freq: Every evening | ORAL | Status: DC | PRN

## 2024-05-11 MED ORDER — FERROUS SULFATE 325 (65 FE) MG PO TABS
325.0000 mg | ORAL_TABLET | ORAL | Status: DC
Start: 2024-05-12 — End: 2024-05-13
  Administered 2024-05-12: 325 mg via ORAL
  Filled 2024-05-11: qty 1

## 2024-05-11 MED ORDER — ONDANSETRON HCL 4 MG/2ML IJ SOLN
INTRAMUSCULAR | Status: DC | PRN
  Administered 2024-05-11: 4 mg via INTRAVENOUS

## 2024-05-11 MED ORDER — FENTANYL CITRATE (PF) 100 MCG/2ML IJ SOLN
INTRAMUSCULAR | Status: AC
  Filled 2024-05-11: qty 2

## 2024-05-11 MED ORDER — ENOXAPARIN SODIUM 60 MG/0.6ML IJ SOSY
45.0000 mg | PREFILLED_SYRINGE | INTRAMUSCULAR | Status: DC
  Administered 2024-05-12 – 2024-05-13 (×2): 45 mg via SUBCUTANEOUS
  Filled 2024-05-11 (×2): qty 0.6

## 2024-05-11 MED ORDER — OXYTOCIN-SODIUM CHLORIDE 30-0.9 UT/500ML-% IV SOLN
INTRAVENOUS | Status: AC
  Filled 2024-05-11: qty 500

## 2024-05-11 MED ORDER — PHENYLEPHRINE 80 MCG/ML (10ML) SYRINGE FOR IV PUSH (FOR BLOOD PRESSURE SUPPORT)
PREFILLED_SYRINGE | INTRAVENOUS | Status: AC
Start: 2024-05-11 — End: 2024-05-11
  Filled 2024-05-11: qty 10

## 2024-05-11 MED ORDER — MEASLES, MUMPS & RUBELLA VAC IJ SOLR: 0.5000 mL | Freq: Once | INTRAMUSCULAR | Status: DC

## 2024-05-11 MED ORDER — NALOXONE HCL 4 MG/10ML IJ SOLN: 1.0000 ug/kg/h | INTRAVENOUS | Status: DC | PRN

## 2024-05-11 MED ORDER — MENTHOL 3 MG MT LOZG: 1.0000 | LOZENGE | OROMUCOSAL | Status: DC | PRN

## 2024-05-11 MED ORDER — ONDANSETRON HCL 4 MG/2ML IJ SOLN: 4.0000 mg | Freq: Three times a day (TID) | INTRAMUSCULAR | Status: DC | PRN

## 2024-05-11 MED ORDER — OXYTOCIN-SODIUM CHLORIDE 30-0.9 UT/500ML-% IV SOLN
INTRAVENOUS | Status: DC | PRN
  Administered 2024-05-11: 300 mL via INTRAVENOUS

## 2024-05-11 MED ORDER — ACETAMINOPHEN 10 MG/ML IV SOLN
INTRAVENOUS | Status: DC | PRN
  Administered 2024-05-11: 1000 mg via INTRAVENOUS

## 2024-05-11 MED ORDER — METOCLOPRAMIDE HCL 5 MG/ML IJ SOLN
INTRAMUSCULAR | Status: AC
  Filled 2024-05-11: qty 2

## 2024-05-11 MED ORDER — MAGNESIUM HYDROXIDE 400 MG/5ML PO SUSP: 30.0000 mL | ORAL | Status: DC | PRN

## 2024-05-11 MED ORDER — WITCH HAZEL-GLYCERIN EX PADS: 1.0000 | MEDICATED_PAD | CUTANEOUS | Status: DC | PRN

## 2024-05-11 MED ORDER — FENTANYL CITRATE (PF) 100 MCG/2ML IJ SOLN
INTRAMUSCULAR | Status: DC | PRN
  Administered 2024-05-11: 100 ug via EPIDURAL

## 2024-05-11 MED ORDER — DIPHENHYDRAMINE HCL 50 MG/ML IJ SOLN: 12.5000 mg | INTRAMUSCULAR | Status: DC | PRN

## 2024-05-11 MED ORDER — MORPHINE SULFATE (PF) 0.5 MG/ML IJ SOLN
INTRAMUSCULAR | Status: AC
  Filled 2024-05-11: qty 10

## 2024-05-11 MED ORDER — TRANEXAMIC ACID-NACL 1000-0.7 MG/100ML-% IV SOLN
INTRAVENOUS | Status: DC | PRN
  Administered 2024-05-11: 1000 mg via INTRAVENOUS

## 2024-05-11 MED ORDER — TETANUS-DIPHTH-ACELL PERTUSSIS 5-2.5-18.5 LF-MCG/0.5 IM SUSY
0.5000 mL | PREFILLED_SYRINGE | Freq: Once | INTRAMUSCULAR | Status: DC
Start: 2024-05-12 — End: 2024-05-13

## 2024-05-11 MED ORDER — ONDANSETRON HCL 4 MG/2ML IJ SOLN
INTRAMUSCULAR | Status: AC
  Filled 2024-05-11: qty 2

## 2024-05-11 MED ORDER — LACTATED RINGERS IV SOLN: INTRAVENOUS | Status: DC

## 2024-05-11 MED ORDER — DIBUCAINE (PERIANAL) 1 % EX OINT: 1.0000 | TOPICAL_OINTMENT | CUTANEOUS | Status: DC | PRN

## 2024-05-11 MED ORDER — DEXMEDETOMIDINE HCL IN NACL 80 MCG/20ML IV SOLN
INTRAVENOUS | Status: AC
  Filled 2024-05-11: qty 20

## 2024-05-11 MED ORDER — ACETAMINOPHEN 500 MG PO TABS
1000.0000 mg | ORAL_TABLET | Freq: Four times a day (QID) | ORAL | Status: DC
  Administered 2024-05-11 – 2024-05-13 (×7): 1000 mg via ORAL
  Filled 2024-05-11 (×7): qty 2

## 2024-05-11 MED ORDER — GABAPENTIN 100 MG PO CAPS
300.0000 mg | ORAL_CAPSULE | Freq: Two times a day (BID) | ORAL | Status: DC
  Administered 2024-05-11 – 2024-05-13 (×4): 300 mg via ORAL
  Filled 2024-05-11 (×5): qty 3

## 2024-05-11 MED ORDER — DROPERIDOL 2.5 MG/ML IJ SOLN: 0.6250 mg | Freq: Once | INTRAMUSCULAR | Status: DC | PRN

## 2024-05-11 MED ORDER — KETOROLAC TROMETHAMINE 30 MG/ML IJ SOLN: 30.0000 mg | Freq: Four times a day (QID) | INTRAMUSCULAR | Status: DC | PRN

## 2024-05-11 MED ORDER — LACTATED RINGERS IV SOLN: INTRAVENOUS | Status: DC | PRN

## 2024-05-11 MED ORDER — DIPHENHYDRAMINE HCL 25 MG PO CAPS: 25.0000 mg | ORAL_CAPSULE | ORAL | Status: DC | PRN

## 2024-05-11 MED ORDER — SODIUM CHLORIDE 0.9% FLUSH: 3.0000 mL | INTRAVENOUS | Status: DC | PRN

## 2024-05-11 MED ORDER — METOCLOPRAMIDE HCL 5 MG/ML IJ SOLN
INTRAMUSCULAR | Status: DC | PRN
  Administered 2024-05-11: 5 mg via INTRAVENOUS

## 2024-05-11 MED ORDER — KETOROLAC TROMETHAMINE 30 MG/ML IJ SOLN
INTRAMUSCULAR | Status: AC
  Filled 2024-05-11: qty 1

## 2024-05-11 MED ORDER — PHENYLEPHRINE HCL-NACL 20-0.9 MG/250ML-% IV SOLN
INTRAVENOUS | Status: AC
Start: 2024-05-11 — End: 2024-05-11
  Filled 2024-05-11: qty 250

## 2024-05-11 MED ORDER — SODIUM CHLORIDE 0.9 % IV SOLN
INTRAVENOUS | Status: DC | PRN
  Administered 2024-05-11: 500 mg via INTRAVENOUS

## 2024-05-11 MED ORDER — STERILE WATER FOR IRRIGATION IR SOLN
Status: DC | PRN
  Administered 2024-05-11: 1000 mL

## 2024-05-11 MED ORDER — CEFAZOLIN SODIUM-DEXTROSE 2-3 GM-%(50ML) IV SOLR
INTRAVENOUS | Status: DC | PRN
  Administered 2024-05-11: 2 g via INTRAVENOUS

## 2024-05-11 MED ORDER — OXYTOCIN-SODIUM CHLORIDE 30-0.9 UT/500ML-% IV SOLN: 2.5000 [IU]/h | INTRAVENOUS | Status: AC

## 2024-05-11 MED ORDER — LIDOCAINE-EPINEPHRINE (PF) 2 %-1:200000 IJ SOLN
INTRAMUSCULAR | Status: DC | PRN
  Administered 2024-05-11: 8 mL via EPIDURAL
  Administered 2024-05-11 (×2): 5 mL via EPIDURAL
  Administered 2024-05-11: 2 mL via EPIDURAL

## 2024-05-11 MED ORDER — LIDOCAINE-EPINEPHRINE (PF) 2 %-1:200000 IJ SOLN
INTRAMUSCULAR | Status: AC
  Filled 2024-05-11: qty 20

## 2024-05-11 MED ORDER — DEXAMETHASONE SODIUM PHOSPHATE 10 MG/ML IJ SOLN
INTRAMUSCULAR | Status: AC
  Filled 2024-05-11: qty 1

## 2024-05-11 MED ORDER — PRENATAL MULTIVITAMIN CH
1.0000 | ORAL_TABLET | Freq: Every day | ORAL | Status: DC
  Administered 2024-05-11 – 2024-05-12 (×2): 1 via ORAL
  Filled 2024-05-11 (×2): qty 1

## 2024-05-11 MED ORDER — KETOROLAC TROMETHAMINE 30 MG/ML IJ SOLN
30.0000 mg | Freq: Four times a day (QID) | INTRAMUSCULAR | Status: AC
Start: 2024-05-11 — End: 2024-05-12
  Administered 2024-05-11 – 2024-05-12 (×4): 30 mg via INTRAVENOUS
  Filled 2024-05-11 (×4): qty 1

## 2024-05-11 MED ORDER — DEXMEDETOMIDINE HCL IN NACL 80 MCG/20ML IV SOLN
INTRAVENOUS | Status: DC | PRN
  Administered 2024-05-11 (×2): 4 ug via INTRAVENOUS

## 2024-05-11 MED ORDER — PHENYLEPHRINE HCL (PRESSORS) 10 MG/ML IV SOLN
INTRAVENOUS | Status: DC | PRN
  Administered 2024-05-11 (×2): 80 ug via INTRAVENOUS
  Administered 2024-05-11: 160 ug via INTRAVENOUS

## 2024-05-11 MED ORDER — FENTANYL CITRATE (PF) 100 MCG/2ML IJ SOLN: 25.0000 ug | INTRAMUSCULAR | Status: DC | PRN

## 2024-05-11 MED ORDER — HYDROMORPHONE HCL 2 MG PO TABS: 2.0000 mg | ORAL_TABLET | ORAL | Status: DC | PRN

## 2024-05-11 MED ORDER — COCONUT OIL OIL: 1.0000 | TOPICAL_OIL | Status: DC | PRN

## 2024-05-11 MED ORDER — SODIUM CHLORIDE 0.9 % IV SOLN
INTRAVENOUS | Status: AC
  Filled 2024-05-11: qty 5

## 2024-05-11 MED ORDER — DEXAMETHASONE SODIUM PHOSPHATE 4 MG/ML IJ SOLN
INTRAMUSCULAR | Status: DC | PRN
  Administered 2024-05-11: 10 mg via INTRAVENOUS

## 2024-05-11 MED ORDER — SENNOSIDES-DOCUSATE SODIUM 8.6-50 MG PO TABS
2.0000 | ORAL_TABLET | Freq: Every day | ORAL | Status: DC
Start: 2024-05-12 — End: 2024-05-13
  Administered 2024-05-12: 2 via ORAL
  Filled 2024-05-11: qty 2

## 2024-05-11 MED ORDER — KETOROLAC TROMETHAMINE 30 MG/ML IJ SOLN
30.0000 mg | Freq: Once | INTRAMUSCULAR | Status: AC
  Administered 2024-05-11: 30 mg via INTRAVENOUS

## 2024-05-11 MED ORDER — SODIUM CHLORIDE 0.9 % IR SOLN
Status: DC | PRN
  Administered 2024-05-11: 1000 mL

## 2024-05-11 MED ORDER — DIPHENHYDRAMINE HCL 25 MG PO CAPS: 25.0000 mg | ORAL_CAPSULE | Freq: Four times a day (QID) | ORAL | Status: DC | PRN

## 2024-05-11 MED ORDER — IBUPROFEN 600 MG PO TABS
600.0000 mg | ORAL_TABLET | Freq: Four times a day (QID) | ORAL | Status: DC
  Administered 2024-05-12 – 2024-05-13 (×3): 600 mg via ORAL
  Filled 2024-05-11 (×3): qty 1

## 2024-05-11 MED ORDER — SIMETHICONE 80 MG PO CHEW: 80.0000 mg | CHEWABLE_TABLET | ORAL | Status: DC | PRN

## 2024-05-11 MED ORDER — OXYCODONE HCL 5 MG PO TABS
5.0000 mg | ORAL_TABLET | ORAL | Status: DC | PRN
  Administered 2024-05-12: 10 mg via ORAL
  Administered 2024-05-13: 5 mg via ORAL
  Filled 2024-05-11: qty 2
  Filled 2024-05-11: qty 1

## 2024-05-11 MED ORDER — ACETAMINOPHEN 500 MG PO TABS: 1000.0000 mg | ORAL_TABLET | Freq: Four times a day (QID) | ORAL | Status: DC

## 2024-05-11 SURGICAL SUPPLY — 32 items
CHLORAPREP W/TINT 26 (MISCELLANEOUS) ×2 IMPLANT
CLAMP UMBILICAL CORD (MISCELLANEOUS) ×1 IMPLANT
CLOTH BEACON ORANGE TIMEOUT ST (SAFETY) ×1 IMPLANT
DERMABOND ADVANCED .7 DNX12 (GAUZE/BANDAGES/DRESSINGS) IMPLANT
DRSG OPSITE POSTOP 4X10 (GAUZE/BANDAGES/DRESSINGS) ×1 IMPLANT
ELECTRODE REM PT RTRN 9FT ADLT (ELECTROSURGICAL) ×1 IMPLANT
EXTRACTOR VACUUM KIWI (MISCELLANEOUS) IMPLANT
GAUZE PAD ABD 7.5X8 STRL (GAUZE/BANDAGES/DRESSINGS) IMPLANT
GAUZE SPONGE 4X4 12PLY STRL LF (GAUZE/BANDAGES/DRESSINGS) IMPLANT
GLOVE BIOGEL PI IND STRL 7.0 (GLOVE) ×2 IMPLANT
GLOVE ECLIPSE 7.0 STRL STRAW (GLOVE) ×1 IMPLANT
GOWN STRL REUS W/TWL LRG LVL3 (GOWN DISPOSABLE) ×2 IMPLANT
HEMOSTAT ARISTA ABSORB 3G PWDR (HEMOSTASIS) IMPLANT
KIT ABG SYR 3ML LUER SLIP (SYRINGE) ×1 IMPLANT
MAT PREVALON FULL STRYKER (MISCELLANEOUS) IMPLANT
NDL HYPO 25X5/8 SAFETYGLIDE (NEEDLE) ×1 IMPLANT
NEEDLE HYPO 25X5/8 SAFETYGLIDE (NEEDLE) ×1 IMPLANT
NS IRRIG 1000ML POUR BTL (IV SOLUTION) ×1 IMPLANT
PACK C SECTION WH (CUSTOM PROCEDURE TRAY) ×1 IMPLANT
PAD OB MATERNITY 4.3X12.25 (PERSONAL CARE ITEMS) ×1 IMPLANT
RTRCTR C-SECT PINK 25CM LRG (MISCELLANEOUS) ×1 IMPLANT
SPONGE T-LAP 18X18 ~~LOC~~+RFID (SPONGE) IMPLANT
SUT MNCRL 0 VIOLET CTX 36 (SUTURE) ×1 IMPLANT
SUT PLAIN 0 NONE (SUTURE) IMPLANT
SUT PLAIN 2 0 XLH (SUTURE) IMPLANT
SUT PLAIN ABS 2-0 CT1 27XMFL (SUTURE) IMPLANT
SUT VIC AB 0 CTX36XBRD ANBCTRL (SUTURE) ×1 IMPLANT
SUT VIC AB 2-0 CT1 TAPERPNT 27 (SUTURE) IMPLANT
SUT VIC AB 4-0 KS 27 (SUTURE) ×1 IMPLANT
TOWEL OR 17X24 6PK STRL BLUE (TOWEL DISPOSABLE) ×1 IMPLANT
TRAY FOLEY W/BAG SLVR 14FR LF (SET/KITS/TRAYS/PACK) IMPLANT
WATER STERILE IRR 1000ML POUR (IV SOLUTION) ×1 IMPLANT

## 2024-05-11 NOTE — Transfer of Care (Signed)
 Immediate Anesthesia Transfer of Care Note  Patient: Paramedic  Procedure(s) Performed: CESAREAN DELIVERY  Patient Location: PACU  Anesthesia Type:Epidural  Level of Consciousness: awake, alert , and oriented  Airway & Oxygen Therapy: Patient Spontanous Breathing  Post-op Assessment: Report given to RN and Post -op Vital signs reviewed and stable  Post vital signs: Reviewed and stable  Last Vitals:  Vitals Value Taken Time  BP 106/62 05/11/24 11:41  Temp    Pulse 90 05/11/24 11:45  Resp 23 05/11/24 11:45  SpO2 93 % 05/11/24 11:45  Vitals shown include unfiled device data.  Last Pain:  Vitals:   05/11/24 0831  TempSrc: Oral  PainSc:          Complications: No notable events documented.

## 2024-05-11 NOTE — Progress Notes (Signed)
 Labor Progress Note Stephanie [name] is a 28 y.o. [phone] at [redacted]w[redacted]d presented for IOL/TOLAC for resolved FGR  S:  Feeling constant pressure  O:  BP 118/72   Pulse 84   Temp 98.3 F (36.8 C) (Oral)   Resp 16   Ht 5' 3 (1.6 m)   Wt 94.6 kg   LMP 08/04/2023 (Exact Date)   SpO2 100%   BMI 36.95 kg/m  EFM: baseline 180 bpm/ moderate variability/ + accels/ early and variable decels  Toco/IUPC: ctx q2-21min SVE: Dilation: 10 Dilation Complete Date: 05/11/24 Dilation Complete Time: 0157 Effacement (%): 100 Cervical Position: Posterior Station: Plus 1 Presentation: Vertex Exam by:: Dr. Jomarie  A/P: 28 y.o. H6E9888 [redacted]w[redacted]d in active labor 1. Labor: Patient seen at bedside while pushing, no change in station. On exam, cervix on patient's L side continues to be thin but present. Fetal tachycardia noted. Per patient request, discussed risks and benefits of TOLAC vs RLTCS. Patient would like to continue TOLAC. Will give LR bolus and benadryl , patient would like to rest. Per patient request, pitocin  stopped by nursing to allow patient to relax.  2. Pain: Epidural 3. GBS positive, adequate ppx given   Charlie DELENA Jomarie, MD 6:17 AM

## 2024-05-11 NOTE — Progress Notes (Addendum)
 Labor Progress Note Stephanie [name] is a 28 y.o. [phone] at [redacted]w[redacted]d presented for IOL/TOLAC for resolved FGR  S:  Feeling constant pressure and nausea  O:  BP 102/60   Pulse 64   Temp 98.3 F (36.8 C) (Oral)   Resp 16   Ht 5' 3 (1.6 m)   Wt 94.6 kg   LMP 08/04/2023 (Exact Date)   SpO2 100%   BMI 36.95 kg/m  EFM: baseline 160 bpm/ moderate variability/ + accels/ - decels  Toco/IUPC: ctx q24min SVE: Dilation: 10 Dilation Complete Date: 05/11/24 Dilation Complete Time: 0157 Effacement (%): 100 Cervical Position: Posterior Station: 0 Presentation: Vertex Exam by:: Kent Solian RN Pitocin : 12 mu/min  A/P: 28 y.o. H6E9888 [redacted]w[redacted]d in second stage of labor 1. Labor: Complete and 0 station. Received report from nurse that after practice pushes, the EFM baseline went from 120 to 160. Plan to allow fetal recovery and labor down. Will monitor closely for changes 2. Pain: Epidural 3. GBS positive, adequate ppx received  - Anticipate SVD.  Charlie DELENA Courts, MD 2:41 AM

## 2024-05-11 NOTE — Progress Notes (Signed)
 LABOR PROGRESS NOTE  Stephanie [name] is a 28 y.o. H6E9888 at [redacted]w[redacted]d  admitted for IOL.  Subjective: Epidural working well, feeling tired  Objective: BP 117/68   Pulse 69   Temp 98.8 F (37.1 C) (Oral)   Resp 14   Ht 5' 3 (1.6 m)   Wt 94.6 kg   LMP 08/04/2023 (Exact Date)   SpO2 98%   BMI 36.95 kg/m  or  Vitals:   05/11/24 0631 05/11/24 0700 05/11/24 0731 05/11/24 0831  BP: 113/68 112/73 118/65 117/68  Pulse: 76 75 71 69  Resp:    14  Temp:    98.8 F (37.1 C)  TempSrc:    Oral  SpO2:  98%    Weight:      Height:        Physical Exam Vitals reviewed.  Constitutional:      General: She is not in acute distress.    Appearance: She is well-developed. She is not diaphoretic.  Cardiovascular:     Rate and Rhythm: Normal rate and regular rhythm.     Heart sounds: Normal heart sounds. No murmur heard. Pulmonary:     Effort: Pulmonary effort is normal. No respiratory distress.     Breath sounds: Normal breath sounds. No wheezing or rales.  Abdominal:     General: Bowel sounds are normal. There is no distension.     Palpations: Abdomen is soft.     Tenderness: There is no abdominal tenderness. There is no guarding or rebound.  Skin:    General: Skin is warm and dry.  Neurological:     Mental Status: She is alert.     Coordination: Coordination normal.     Dilation: Lip/rim Dilation Complete Date: 05/11/24 Dilation Complete Time: 0157 Effacement (%): 100 Cervical Position: Posterior Station: Plus 1 Presentation: Vertex Exam by:: Dr. Lola EHRICH Baseline: 165 bpm Variability: mix of minimal and moderate, mostly minimal Accelerations: absent Decelerations: mostly lates Uterine activity: irregular q3-6 min Cat: II  Labs: Lab Results  Component Value Date   WBC 9.7 05/10/2024   HGB 14.0 05/10/2024   HCT 41.6 05/10/2024   MCV 86.0 05/10/2024   PLT 318 05/10/2024    Patient Active Problem List   Diagnosis Date Noted   Term pregnancy 05/10/2024    GBS (group B Streptococcus carrier), +RV culture, currently pregnant 05/09/2024   Poor fetal growth affecting management of mother in third trimester 01/13/2024   History of fetal growth restriction 12/18/2023   History of preterm delivery 11/29/2023   History of cesarean delivery, antepartum 11/29/2023   History of severe pre-eclampsia 10/08/2020   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 H6E9888 at [redacted]w[redacted]d here for IOL.  Labor: patient is s/p FB, pitocin  since 0800 yesterday AM, and AROM at 2035 the night prior. Has been lip/rim since around 0100. Had a period of pushing around 0400 with no significant descent. When I came on this morning tracing was primarily cat II with long periods of minimal variability. I went to speak with patient and examined her, she had a small rim of cervix on her L side and was at +1 station. We discussed that given lack of variability for long periods over last few hours including at present, as well as still being at +1 station, that we had two options. I offered to push again and that if she was able to get significant movement on the baby that we could consider expedited delivery with vacuum  and that I did not think baby would tolerate a prolonged period of pushing without an operative delivery. Alternatively given clinical scenario at present we could proceed directly to cesarean. Based on situation I recommended proceeding to cesarean but that I was willing to try.  She elected to push. We pushed three times and baby actually tolerated it fairly well, however there was very little movement and baby returned immediately to high station. At that point I recommended again proceeding with cesarean and patient was amenable.   The risks of cesarean section discussed with the patient included but were not limited to: bleeding which may require transfusion or reoperation; infection which may require antibiotics; injury to bowel, bladder, ureters or other  surrounding organs; injury to the fetus; need for additional procedures including hysterectomy in the event of a life-threatening hemorrhage; placental abnormalities with subsequent pregnancies, incisional problems, thromboembolic phenomenon and other postoperative/anesthesia complications. The patient concurred with the proposed plan, giving informed written consent for the procedure. Patient NPO status waived given urgency of case. Anesthesia and OR aware. Preoperative prophylactic antibiotics and SCDs ordered on call to the OR.   Fetal Wellbeing:  Cat II Pain Control:  epidural GBS: positive, adequately treated on penicillin  Anticipated MOD:  proceeding with repeat cesarean   Donnice CHRISTELLA Carolus, MD/MPH Attending Family Medicine Physician, [facility] for [facility], [facility]   05/11/2024, 9:22 AM

## 2024-05-11 NOTE — Anesthesia Postprocedure Evaluation (Signed)
 Anesthesia Post Note  Patient: Paramedic  Procedure(s) Performed: CESAREAN DELIVERY     Patient location during evaluation: PACU Anesthesia Type: Epidural Level of consciousness: awake and alert Pain management: pain level controlled Vital Signs Assessment: post-procedure vital signs reviewed and stable Respiratory status: spontaneous breathing, nonlabored ventilation and respiratory function stable Cardiovascular status: blood pressure returned to baseline Postop Assessment: epidural receding, no apparent nausea or vomiting, no headache and no backache Anesthetic complications: no   No notable events documented.  Last Vitals:  Vitals:   05/11/24 1215 05/11/24 1315  BP: (!) 92/56 (!) 93/58  Pulse: 86 66  Resp: (!) 28 17  Temp: 37.1 C 37.2 C  SpO2: 93% 97%    Last Pain:  Vitals:   05/11/24 1315  TempSrc: Oral  PainSc:                  Vertell Row

## 2024-05-11 NOTE — Op Note (Addendum)
 Cesarean Section Operative Note   Patient: Stephanie [name]  Date of Procedure: 05/11/2024  Procedure: Repeat Low Transverse Cesarean   Indications: non-reassuring fetal status  Pre-operative Diagnosis: Arrest of Decent non reassuring fetal heart rate.   Post-operative Diagnosis: Same  TOLAC Candidate: No  Surgeon: Surgeons and Role:    * Lola Donnice HERO, MD - Primary  Assistants: Magali Bishop, MD  An experienced assistant was required given the standard of surgical care given the complexity of the case.  This assistant was needed for exposure, dissection, suctioning, retraction, instrument exchange, assisting with delivery with administration of fundal pressure, and for overall help during the procedure.   Anesthesia: epidural  Anesthesiologist: No responsible provider has been recorded for the case.   Antibiotics: Cefazolin  and Azithromycin    Estimated Blood Loss: 764 ml   Total IV Fluids: 3267.6 ml  Urine Output: 450 cc OF clear urine  Specimens: None   Complications: L sided extension in the direction of the cervix   The risks of cesarean section discussed with the patient included but were not limited to: bleeding which may require transfusion or reoperation; infection which may require antibiotics; injury to bowel, bladder, ureters or other surrounding organs; injury to the fetus; need for additional procedures including hysterectomy in the event of a life-threatening hemorrhage; placental abnormalities with subsequent pregnancies, incisional problems, thromboembolic phenomenon and other postoperative/anesthesia complications. The patient concurred with the proposed plan, giving informed written consent for the procedure. Patient NPO status waived given urgency of case. Anesthesia and OR aware. Preoperative prophylactic antibiotics and SCDs ordered on call to the OR.    Indications: Stephanie [name] is a 28 y.o. [phone] with an IUP [redacted]w[redacted]d presenting for unscheduled,  urgent cesarean secondary to the indications listed above. Clinical course notable for non-reassuring fetal status.   Findings: Viable infant in cephalic presentation, no nuchal cord present. Apgars 9, 9, . Weight 3260 g. Clear amniotic fluid with terminal meconium. Normal placenta, three vessel cord. Normal uterus, Normal bilateral fallopian tubes, Normal bilateral ovaries. Minimal adhesive disease present on lower uterine segment.  Procedure Details: A Time Out was held and the above information confirmed. The patient received intravenous antibiotics and had sequential compression devices applied to her lower extremities preoperatively. The patient was taken back to the operative suite where epidural anesthesia was administered. After induction of anesthesia, the patient was draped and prepped in the usual sterile manner and placed in a dorsal supine position with a leftward tilt. A low transverse skin incision was made with scalpel and carried down through the subcutaneous tissue to the fascia. Fascial incision was made and extended transversely. The fascia was separated from the underlying rectus tissue superiorly and inferiorly. The rectus muscles were separated in the midline bluntly and the peritoneum was entered bluntly. An Alexis retractor was placed to aid in visualization of the uterus. The lower uterine segment was bulging and causing an adhesion of the bladder to the lower uterine segment to auto-lyse, however no window was noted. This process was then further developed to create a bladder flap. A low transverse uterine incision was made. The infant was successfully delivered from cephalic presentation, the umbilical cord was clamped after 1 minute. Cord ph was sent, and cord blood was obtained for evaluation. The placenta was removed Intact and appeared normal. The uterine incision was closed with a single layer running unlocked suture of 0-Monocryl, with attention paid especially to an inferior  extension along the left side of the hysterotomy that was about  3-4 cm in length. Due to ongoing bleeding from the hysterotomy figure of eight sutures of 0 Monocryl were placed after which there was excellent hemostasis. Also 4-0 Vicryl used to achieve hemostasis on right side next to uterine artery. In addition, a layer of Arista was placed.  The abdomen and pelvis were cleared of all clot and debris and the Thersia was removed. Hemostasis was confirmed on all surfaces.  The peritoneum was reapproximated using 2-0 vicryl . The fascia was then closed using 0 Vicryl in a running fashion. The subcutaneous layer was reapproximated with 2-0 plain gut suture. The skin was closed with a 4-0 vicryl subcuticular stitch. The patient tolerated the procedure well. Sponge, lap, instrument and needle counts were correct x 2. She was taken to the recovery room in stable condition.   Disposition: PACU - hemodynamically stable.    Signed:  Barkley Angles, MD OB Fellow, Faculty Practice [facility], Center for [facility] of Attending Supervision of Obstetric Fellow During Surgery: An experienced assistant was required given the standard of surgical care given the complexity of the case.  This assistant was needed for exposure, dissection, suctioning, retraction, instrument exchange, assisting with delivery with administration of fundal pressure, and for overall help during the procedure. Surgery was performed with the Obstetric Fellow under my supervision and collaboration.  I was present and scrubbed for the entire procedure.   I have reviewed the Obstetric Fellow's operative report, and I agree with the documentation except as noted below.  I have edited the above note for accuracy and clarity  Donnice CHRISTELLA Carolus, MD/MPH Attending Family Medicine Physician, [facility] for [facility], [facility]

## 2024-05-11 NOTE — Progress Notes (Signed)
 Labor Progress Note Stephanie [name] is a 28 y.o. [phone] at [redacted]w[redacted]d presented for IOL/TOLAC for resolved FGR  S:  Comfortable, starting to feel contractions again  O:  BP 112/73   Pulse 75   Temp 98.2 F (36.8 C) (Oral)   Resp 16   Ht 5' 3 (1.6 m)   Wt 94.6 kg   LMP 08/04/2023 (Exact Date)   SpO2 98%   BMI 36.95 kg/m  EFM: baseline 170 bpm/ minimal variability/ - accels/ isolated late decel  Toco/IUPC: ctx q9min SVE: Dilation: Lip/rim Dilation Complete Date: 05/11/24 Dilation Complete Time: 0157 Effacement (%): 100 Cervical Position: Posterior Station: Plus 1 Presentation: Vertex Exam by:: Dr. Jomarie Pitocin : 2 mu/min  A/P: 28 y.o. [id number] [redacted]w[redacted]d transitioning into second stage of labor 1. Labor: Seen at bedside for exam, fully dilated and +1. Fetal tachycardia still present but improving. Discussed that since pitocin  was stopped, will need to restart it and have adequate contractions prior to pushing. IUPC remained in place, amnioinfusion still running.  2. Pain: Epidural 3. GBS positive, adequate ppx given  - Anticipate SVD.  Charlie DELENA Jomarie, MD 7:39 AM

## 2024-05-11 NOTE — Progress Notes (Signed)
 Labor Progress Note Analina Filla is a 28 y.o. [phone] at [redacted]w[redacted]d presented for IO/TOLAC for resolved FGR  S:  Feeling constant pressure  O:  BP 102/60   Pulse 64   Temp 98.3 F (36.8 C) (Oral)   Resp 16   Ht 5' 3 (1.6 m)   Wt 94.6 kg   LMP 08/04/2023 (Exact Date)   SpO2 100%   BMI 36.95 kg/m  EFM: baseline 150 bpm/ moderate variability/ + accels/ early decels  Toco/IUPC: ctx q28min SVE: Dilation: Lip/rim Effacement (%): 100 Cervical Position: Posterior Station: -1, 0 Presentation: Vertex Exam by:: Dr. Jomarie Pitocin : 12 mu/min  A/P: 28 y.o. [id number] [redacted]w[redacted]d in active labor 1. Labor: Seen at bedside to begin pushing, cervix noted on L side, plan for position changes with continued augmentation with pitocin , monitoring closely 2. Pain: Epidural 3. GBS positive, adequate ppx received   - Anticipate SVD.  Charlie DELENA Jomarie, MD 3:46 AM

## 2024-05-11 NOTE — Lactation Note (Signed)
 This note was copied from a baby's chart. Lactation Consultation Note  Patient Name: Stephanie [name] [name] Date: 05/11/2024 Age:29 hours   P2. Mom told LC she didn't want to see Lactation tonight maybe tomorrow.  Maternal Data    Feeding Nipple Type: Slow - flow  LATCH Score                    Lactation Tools Discussed/Used    Interventions    Discharge    Consult Status      Jonmarc Bodkin G 05/11/2024, 11:12 PM

## 2024-05-12 ENCOUNTER — Encounter (HOSPITAL_COMMUNITY): Payer: Self-pay | Admitting: Obstetrics and Gynecology

## 2024-05-12 LAB — CBC
HCT: 33.1 % — ABNORMAL LOW (ref 36.0–46.0)
Hemoglobin: 10.9 g/dL — ABNORMAL LOW (ref 12.0–15.0)
MCH: 29.1 pg (ref 26.0–34.0)
MCHC: 32.9 g/dL (ref 30.0–36.0)
MCV: 88.3 fL (ref 80.0–100.0)
Platelets: 211 K/uL (ref 150–400)
RBC: 3.75 MIL/uL — ABNORMAL LOW (ref 3.87–5.11)
RDW: 13.3 % (ref 11.5–15.5)
WBC: 13.2 K/uL — ABNORMAL HIGH (ref 4.0–10.5)
nRBC: 0 % (ref 0.0–0.2)

## 2024-05-12 NOTE — Lactation Note (Signed)
 This note was copied from a baby's chart. Lactation Consultation Note  Patient Name: Stephanie [name] [name] Date: 05/12/2024 Age:29 hours    P2, 40 wks, @ 27 hrs of life. Discussed mom's previous experience- Baby didn't latch and mom supplemented. With this baby, she worked a short while @ breast but not a long time- per mom. Talked to mom about the differences between breast and bottle, encouraged hand expression to start a feed, and breast compression during a feeding to keep baby interested. Highlighted how much learning is happening for both mom and baby- and how much encouragement baby needs in early days to keep working @ breast. Reassured mom cluster feeding nights transitions milk in, and breasts respond faster/easier with each pregnancy. Mom has been provided a hand pump, and has collections of colostrum in the fridge. Encouraged mom EBM can sit on table for 4 hours and could be fed straight to baby through nippled bottled. One ounce bottles and variety of flanges provided to mom.  Discussed expectations @ breast- Day 1- sleepy/ feed every 3 hrs/ even 10 minutes is okay, Day 2 more awake/ feeding cues/longer feeds, and cluster feeding overnights brings milk in. Highlighted breast stimulation is tied directly to milk production. Discussed hands on breast and baby, keeping baby awake @ breast. Starting with hand expression & breast compression to get baby working @ breast, and gentle stimulation to keep baby working @ breast. Encouraged EBM for nipple care post feed. LC services and milk storage shared. Encouraged mom to call for assist anytime desired.  Maternal Data Does the patient have breastfeeding experience prior to this delivery?: No How long did the patient breastfeed?: Per mom- first baby did not latch  Feeding Mother's Current Feeding Choice: Breast Milk and Formula   Lactation Tools Discussed/Used Tools: Pump;Flanges Breast pump type: Manual Pump Education: Milk  Storage;Setup, frequency, and cleaning  Interventions Interventions: Breast feeding basics reviewed;Hand express;Breast compression;Expressed milk;Hand pump;Education;LC Services brochure;CDC milk storage guidelines  Discharge Pump: DEBP;Manual;Personal  Consult Status Consult Status: Follow-up Date: 05/13/24 Follow-up type: In-patient    Saksham Akkerman 05/12/2024, 1:48 PM

## 2024-05-12 NOTE — Patient Instructions (Signed)
 If interested in an outpatient lactation consult in office or virtually please reach out to us  at [facility] (First Floor) [address] [place] Please feel free to out with any lactation related questions or concerns [phone]  to leave a message for our lactation voicemail box.  Lactation support groups:  [facility], Tuesdays 10:00 am -12:00 pm at [address] on the second floor in the conference room, lactating parents and lap babies welcome.  [email or web address]  [email or web address]     Delaney Mandril, [facility] Center for [facility]

## 2024-05-12 NOTE — Progress Notes (Signed)
 POSTPARTUM PROGRESS NOTE  Post Partum Day 1  Subjective:  Stephanie [name] is a 28 y.o. H6E8887 s/p LTCS at [redacted]w[redacted]d.  She reports she is doing well. No acute events overnight. She denies any problems with ambulating, voiding or po intake. Pain is well controlled.  Lochia is Normal.  Objective: Blood pressure 107/75, pulse 81, temperature 98.8 F (37.1 C), temperature source Oral, resp. rate 18, height 5' 3 (1.6 m), weight 94.6 kg, last menstrual period 08/04/2023, SpO2 99%, unknown if currently breastfeeding.  BP Readings from Last 3 Encounters:  05/12/24 107/75  05/06/24 114/78  05/05/24 109/76    Physical Exam:  General: alert, cooperative and no distress Chest: no respiratory distress Heart:regular rate Uterine Fundus: firm, appropriately tender DVT Evaluation: No calf swelling or tenderness Extremities: none edema Skin: warm, dry  Recent Labs    05/10/24 0743 05/12/24 0429  HGB 14.0 10.9*  HCT 41.6 33.1*    Assessment/Plan: Laryn Venning is a 28 y.o. [id number] s/p LTCS at [redacted]w[redacted]d   PPD# 1 - Doing well  Routine postpartum care  Delivery Complications: anemia, CS due to non-reassuring fetal tracing  Blood Pressure: normal Anemia/Hb Status: Stable, appropriate postpartum Hb, no intervention indicated Contraception: Not interested as of right now  Feeding: breast feeding and bottle feeding Needs Lactation Consultation: Yes    Dispo: Plan for discharge tomorrow.   LOS: 2 days   Cozetta JINNY Pereyra, Medical Student OB Fellow  05/12/2024, 8:39 AM

## 2024-05-13 MED ORDER — SENNOSIDES-DOCUSATE SODIUM 8.6-50 MG PO TABS: 2.0000 | ORAL_TABLET | Freq: Every day | ORAL | 0 refills | Status: AC

## 2024-05-13 MED ORDER — OXYCODONE HCL 5 MG PO TABS: 5.0000 mg | ORAL_TABLET | ORAL | 0 refills | Status: AC | PRN

## 2024-05-13 MED ORDER — ACETAMINOPHEN 500 MG PO TABS: 1000.0000 mg | ORAL_TABLET | Freq: Four times a day (QID) | ORAL | Status: AC

## 2024-05-13 MED ORDER — FERROUS SULFATE 325 (65 FE) MG PO TABS: 325.0000 mg | ORAL_TABLET | ORAL | 3 refills | Status: AC

## 2024-05-13 MED ORDER — IBUPROFEN 600 MG PO TABS: 600.0000 mg | ORAL_TABLET | Freq: Four times a day (QID) | ORAL | 0 refills | Status: AC

## 2024-05-13 NOTE — Discharge Summary (Signed)
 Postpartum Discharge Summary     Patient Name: Stephanie [name] DOB: Dec 25, 1995 MRN: [id number]  Date of admission: 05/10/2024 Delivery date:05/11/2024 Delivering provider: LOLA DONNICE HERO Date of discharge: 05/13/2024  Admitting diagnosis: Term pregnancy [Z34.90] Intrauterine pregnancy: [redacted]w[redacted]d     Secondary diagnosis:  Principal Problem:   Term pregnancy  Additional problems: history of severe pre-eclampsia, history of preterm delivery, history of cesarean delivery, history of fetal growth restriction, GBS, +RV culture, poor fetal growth affecting management of mother in third trimester, CS due to non-reassuring fetal heart rate and status, arrest of decent Discharge diagnosis: Term Pregnancy Delivered                                              Post partum procedures:none  Augmentation: FB, AROM and Pitocin , ultimately proceeded with CS due to non reassuring fetal status Complications: None  Hospital course: Induction of Labor With Cesarean Section   28 y.o. yo [id number] at [redacted]w[redacted]d was admitted to the hospital 05/10/2024 for induction of labor. Patient had a labor course significant for IOL for FGR. FB, Pitocin  and AROM were utilized for induction, however C-section was indicated due to arrest of of descent and non-reassuring FHR. The patient went for cesarean section due to Arrest of Descent and Non-Reassuring FHR. Delivery details are as follows: Membrane Rupture Time/Date: 8:34 PM,05/10/2024  Delivery Method:C-Section, Low Transverse Operative Delivery: N/a; CS  Details of operation can be found in separate operative Note.  Patient had a postpartum course complicated by none. She is ambulating, tolerating a regular diet, passing flatus, and urinating well.  Patient is discharged home in stable condition on 05/13/24.      Oral iron therapy started for clinically significant postpartum anemia due to expected blood loss after delivery.    Newborn Data: Birth date:05/11/2024 Birth  time:10:20 AM Gender:Female Living status:Living Apgars:9 ,9  Weight:3260 g                               Magnesium  Sulfate received: No BMZ received: No Rhophylac:N/A, Rh (+) MMR: Offered, according to chart review Tdap received 03/31/2024. Rubella immune  T-DaP:Given prenatally Flu: Yes RSV Vaccine received: No Transfusion:No  Immunizations received: Immunization History  Administered Date(s) Administered   Influenza,inj,Quad PF,6+ Mos 05/15/2021   Tdap 06/12/2021, 03/31/2024    Physical exam  Vitals:   05/12/24 0550 05/12/24 1445 05/12/24 2046 05/13/24 0535  BP: 107/75 104/70 (!) 106/55 100/69  Pulse: 81 100 77 68  Resp: 18  18 18   Temp: 98.8 F (37.1 C) 98.7 F (37.1 C) 98.4 F (36.9 C) 98.2 F (36.8 C)  TempSrc: Oral Oral Oral Oral  SpO2: 99% 99% 99% 99%  Weight:      Height:       General: alert, cooperative, and no distress Lochia: appropriate Uterine Fundus: firm Incision: Healing well with no significant drainage DVT Evaluation: No evidence of DVT seen on physical exam. Labs: Lab Results  Component Value Date   WBC 13.2 (H) 05/12/2024   HGB 10.9 (L) 05/12/2024   HCT 33.1 (L) 05/12/2024   MCV 88.3 05/12/2024   PLT 211 05/12/2024      Latest Ref Rng & Units 05/05/2024    4:03 PM  CMP  Glucose 70 - 99 mg/dL 93   BUN 6 - 20  mg/dL 8   Creatinine 9.55 - 8.99 mg/dL 9.44   Sodium 864 - 854 mmol/L 136   Potassium 3.5 - 5.1 mmol/L 3.4   Chloride 98 - 111 mmol/L 106   CO2 22 - 32 mmol/L 18   Calcium 8.9 - 10.3 mg/dL 8.9   Total Protein 6.5 - 8.1 g/dL 6.0   Total Bilirubin 0.0 - 1.2 mg/dL 0.6   Alkaline Phos 38 - 126 U/L 114   AST 15 - 41 U/L 14   ALT 0 - 44 U/L 13    Edinburgh Score:    05/11/2024    1:15 PM  Edinburgh Postnatal Depression Scale Screening Tool  I have been able to laugh and see the funny side of things. 0  I have looked forward with enjoyment to things. 0  I have blamed myself unnecessarily when things went wrong. 1  I have  been anxious or worried for no good reason. 1  I have felt scared or panicky for no good reason. 1  Things have been getting on top of me. 0  I have been so unhappy that I have had difficulty sleeping. 0  I have felt sad or miserable. 0  I have been so unhappy that I have been crying. 0  The thought of harming myself has occurred to me. 0  Edinburgh Postnatal Depression Scale Total 3   No data recorded  After visit meds:  Allergies as of 05/13/2024   No Known Allergies      Medication List     STOP taking these medications    aspirin  EC 81 MG tablet       TAKE these medications    acetaminophen  500 MG tablet Commonly known as: TYLENOL  Take 2 tablets (1,000 mg total) by mouth every 6 (six) hours.   ferrous sulfate  325 (65 FE) MG tablet Take 1 tablet (325 mg total) by mouth every other day. Start taking on: May 14, 2024   ibuprofen  600 MG tablet Commonly known as: ADVIL  Take 1 tablet (600 mg total) by mouth every 6 (six) hours.   oxyCODONE  5 MG immediate release tablet Commonly known as: Oxy IR/ROXICODONE  Take 1 tablet (5 mg total) by mouth every 4 (four) hours as needed for moderate pain (pain score 4-6).   prenatal multivitamin Tabs tablet Take 1 tablet by mouth daily at 12 noon.   senna-docusate 8.6-50 MG tablet Commonly known as: Senokot-S Take 2 tablets by mouth daily.               Discharge Care Instructions  (From admission, onward)           Start     Ordered   05/13/24 0000  Discharge wound care:       Comments: You may remove the honeycomb when your baby is 28 days old by carefully working up both edges and then pulling both ends apart horizontally. The honeycomb is designed to gently release from the incision when removed in this fashion.   05/13/24 0949             Discharge home in stable condition Infant Feeding: Bottle and Breast Infant Disposition:home with mother Discharge instruction: per After Visit Summary and  Postpartum booklet. Activity: Advance as tolerated. Pelvic rest for 6 weeks.  Diet: routine diet Future Appointments: Future Appointments  Date Time Provider Department Center  05/18/2024  9:20 AM [facility] NURSE [facility] None   Follow up Visit:   Please schedule this patient for a In  person postpartum visit in 6 weeks with the following provider: Any provider. Additional Postpartum F/U:Incision check 1 week  High risk pregnancy complicated by: N/A Delivery mode:  C-Section, Low Transverse Anticipated Birth Control:  Unsure  Message sent 9/26 to Fem. -CC  05/13/2024 Gladie Gravette L Gilmore List, MD

## 2024-05-13 NOTE — Lactation Note (Signed)
 This note was copied from a baby's chart. Lactation Consultation Note  Patient Name: Stephanie [name] [name] Date: 05/13/2024 Age:29 hours Reason for consult: Follow-up assessment;1st time breastfeeding;Term  P2- MOB's feeding plan is to offer both breast milk and formula. MOB reports that infant has latched well so far and she has no concerns. MOB requested for more formula. LC provided MOB with Similac 360 and yellow nipples per request. MOB denies having any questions or concerns at this time. LC reviewed engorgement/breast care, CDC milk storage guidelines, and LC services handout. LC encouraged MOB to call for further assistance as needed.  Maternal Data Has patient been taught Hand Expression?: No Does the patient have breastfeeding experience prior to this delivery?: No  Feeding Mother's Current Feeding Choice: Breast Milk and Formula  Lactation Tools Discussed/Used Pump Education: Milk Storage  Interventions Interventions: Breast feeding basics reviewed;Education;LC Services brochure  Discharge Discharge Education: Engorgement and breast care;Warning signs for feeding baby Pump: DEBP;Manual;Personal  Consult Status Consult Status: Complete Date: 05/13/24    Recardo Hoit BS, IBCLC 05/13/2024, 12:22 PM

## 2024-05-13 NOTE — Progress Notes (Addendum)
 POSTPARTUM PROGRESS NOTE  Post Partum Day 2  Subjective:  Stephanie [name] is a 28 y.o. H6E8887 s/p LTCS at [redacted]w[redacted]d.  She reports she is doing well. No acute events overnight. Reports being tired, not getting enoug sleep. She denies any problems with ambulating, voiding or po intake. Pain is well controlled.  Lochia is Normal.  Objective: Blood pressure 100/69, pulse 68, temperature 98.2 F (36.8 C), temperature source Oral, resp. rate 18, height 5' 3 (1.6 m), weight 94.6 kg, last menstrual period 08/04/2023, SpO2 99%, unknown if currently breastfeeding.  BP Readings from Last 3 Encounters:  05/13/24 100/69  05/06/24 114/78  05/05/24 109/76    Physical Exam:  General: alert, cooperative and no distress Chest: no respiratory distress Heart:regular rate Uterine Fundus: firm, appropriately tender DVT Evaluation: No calf swelling or tenderness Extremities: none edema Skin: warm, dry  Recent Labs    05/12/24 0429  HGB 10.9*  HCT 33.1*    Assessment/Plan: Jolee Critcher is a 28 y.o. H6E8887 s/p LTCS at [redacted]w[redacted]d   PPD# 2 - Doing well  Routine postpartum care  Delivery Complications: anemia, CS due to non-reassuring fetal tracing   Blood Pressure: normal Anemia/Hb Status: Stable, appropriate postpartum Hb, no intervention indicated Contraception: Not interested as of right now  Feeding: breast feeding and bottle feeding, has not been able to breastfeed yet but is attempting  Needs Lactation Consultation: No    Dispo: Plan for discharge today.   LOS: 3 days   Cozetta JINNY Pereyra, Medical Student OB Fellow  05/13/2024, 7:56 AM   Attestation of Supervision of Student:  I confirm that I have verified the information documented in the medical student's note and that I have also personally reperformed the history, physical exam and all medical decision making activities.  I have verified that all services and findings are accurately documented in this student's note; and I agree  with management and plan as outlined in the documentation. I have also made any necessary editorial changes.    Barkley LITTIE Angles, MD OB Fellow 05/13/2024 12:22 PM

## 2024-05-16 LAB — SURGICAL PATHOLOGY

## 2024-05-18 ENCOUNTER — Ambulatory Visit

## 2024-05-18 DIAGNOSIS — Z4889 Encounter for other specified surgical aftercare: Secondary | ICD-10-CM | POA: Diagnosis not present

## 2024-05-18 NOTE — Progress Notes (Signed)
 Subjective:     Stephanie [name] is a 28 y.o. female who presents to the clinic 1 weeks status post low uterine, transverse cesarean section. Pt reports incision is healing well.      Objective:    BP 116/76   Pulse 76   LMP 08/04/2023 (Exact Date)  General:  alert, well appearing, in no apparent distress  Incision:   healing well, no drainage, no erythema, no hernia, no seroma, no swelling, no dehiscence, incision well approximated     Assessment:    Doing well postoperatively.   Plan:    1. Continue any current medications. 2. Wound care discussed. 3. Follow up: PP visit or PRN.   Duwaine LOISE Galla, RN
